# Patient Record
Sex: Male | Born: 1995 | Race: Black or African American | Hispanic: No | Marital: Single | Smoking: Never smoker
Health system: Southern US, Community
[De-identification: ages and names within clinical notes are randomized; demographics above are authoritative.]

---
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
## 2016-02-20 IMAGING — CR DG WRIST COMPLETE 3+V*L*
4 series · 4 of 4 positions shown · non-contrast
Comparison: None.

CLINICAL DATA: Pain fell onto outstretched hand

EXAM:
LEFT WRIST - COMPLETE 3+ VIEW

[x wrist pa left]
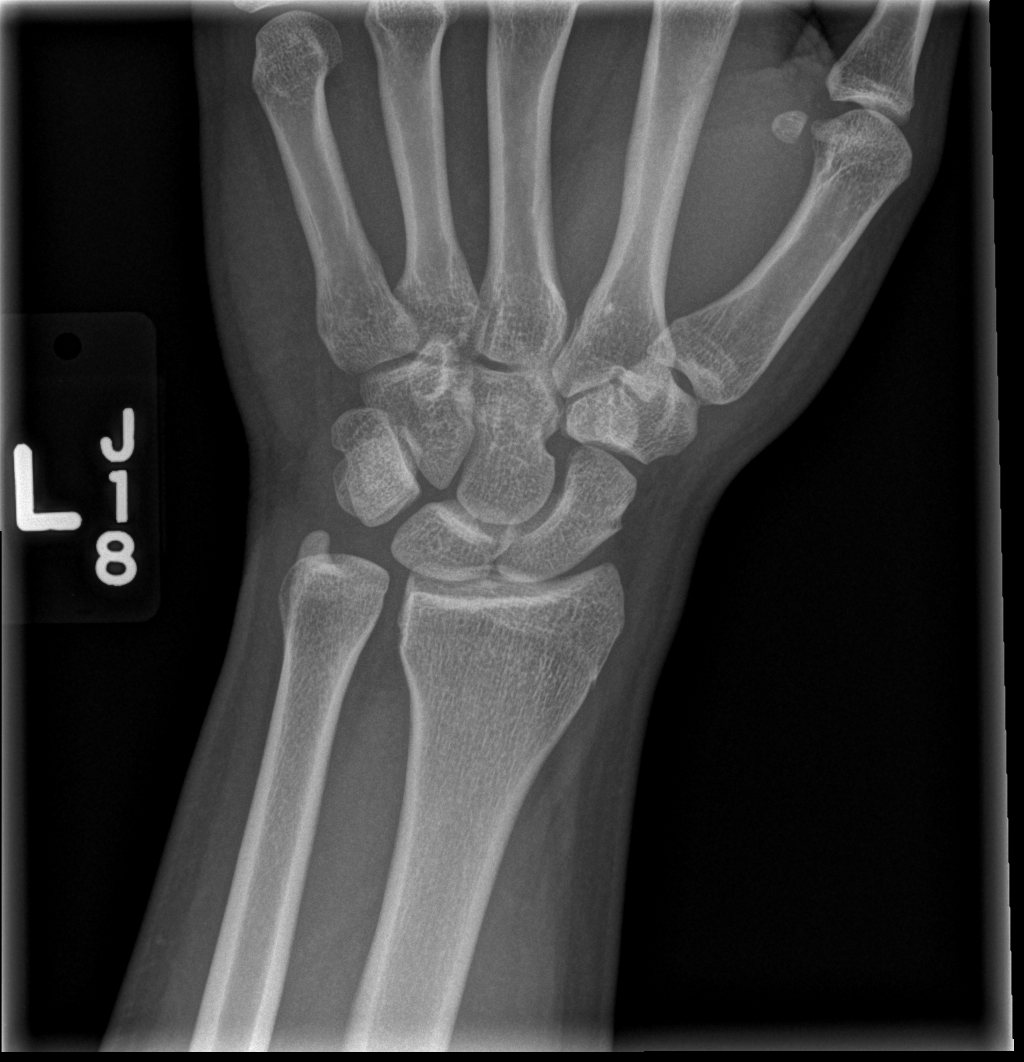

[x wrist obl left]
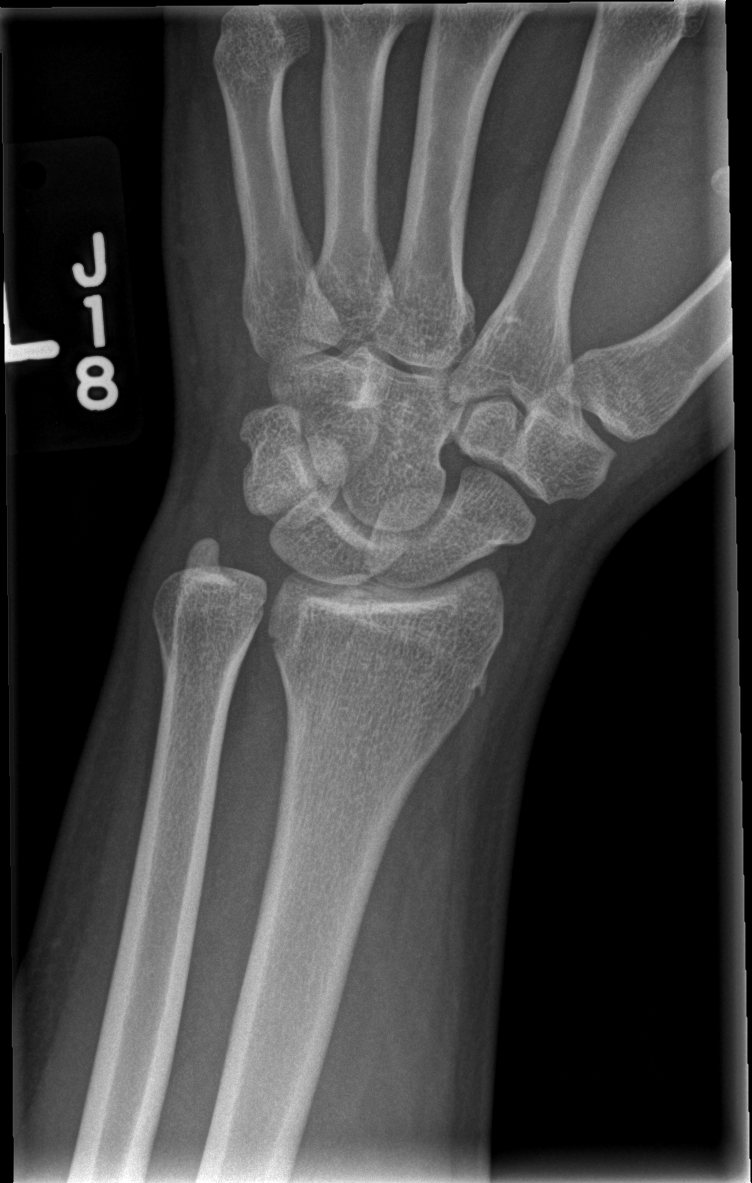

[x wrist lat left]
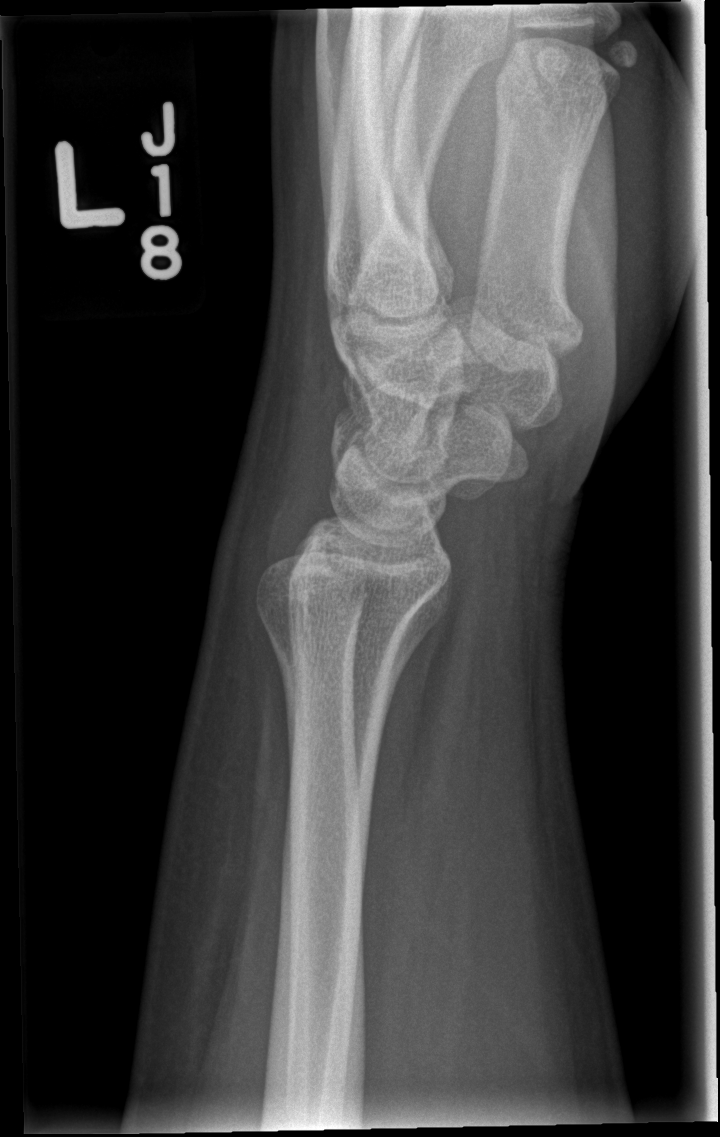

[x wrist navicular view left]
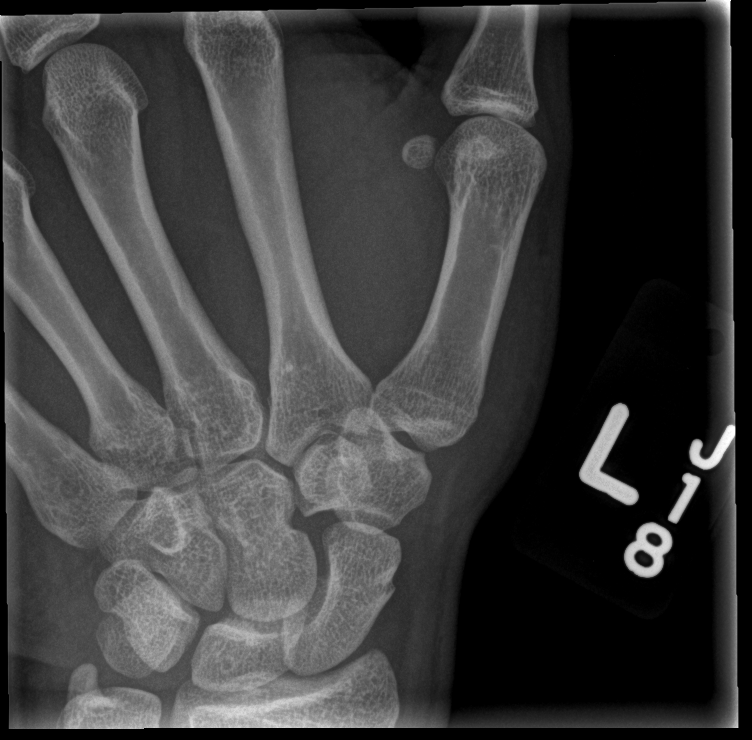

[4 of 4 positions shown; findings below may reference images not displayed]

FINDINGS: Frontal, oblique, lateral, and ulnar deviation scaphoid images were
obtained. There is a subtle linear lucency in the scaphoid bone
distally, concerning for nondisplaced fracture. No other findings
suggesting fracture. No dislocation. Joint spaces appear intact. No
erosive change.
IMPRESSION: Linear lucency in the distal scaphoid bone, best seen on the oblique
view. This appearance is concerning for a nondisplaced fracture in
this region. No other findings suggesting fracture. No dislocation.
No appreciable arthropathic change.

## 2017-06-14 ENCOUNTER — Ambulatory Visit: Admit: 2017-06-14 | Payer: PRIVATE HEALTH INSURANCE

## 2017-06-14 DIAGNOSIS — M79671 Pain in right foot: Secondary | ICD-10-CM

## 2017-06-14 NOTE — Unmapped (Signed)
Subjective   HPI:   Patient ID: Devin Miller is a 21 y.o. male.    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 No injury. No swelling or redness or fever.       He did have episode of cellulits of L foot in [place] and had 2-3 weeks of antibitotics from PMD in [place]. He says L foot is 90 % better with no more redness or pain but slight swelling persists.               Medications:       ROS:   Review of Systems   Constitutional: Negative for chills and fever.   Skin: Negative for rash.       Vitals:    06/14/17 0836   BP: 124/77   Pulse: 69   Temp: 98 ??F (36.7 ??C)   TempSrc: Oral   Weight: 213 lb (96.6 kg)   Height: 5' 10 (1.778 m)     Body mass index is 30.56 kg/m??.  Body surface area is 2.18 meters squared.     Objective:     Physical Exam   Vitals reviewed.  Constitutional: He appears well-nourished. No distress.   Musculoskeletal:   R foot is non tender and no swelling or redness, no obv rash of R foot.    L foot has slight generalized swelling but no erythema or tenderness, slight desquamation skin top L foot                       Assessment/Plan:     1. Arch pain of right foot       He points to arh area of R foot where gets pain and he is prob having some plantar fasciitis pain.   Advil or aleve w food x 5-7 d.  Stretches disc. COnsider sportsmed eval 1 wk if not better.

## 2017-12-23 ENCOUNTER — Other Ambulatory Visit: Admit: 2017-12-23 | Payer: PRIVATE HEALTH INSURANCE

## 2017-12-23 ENCOUNTER — Ambulatory Visit: Admit: 2017-12-23 | Payer: PRIVATE HEALTH INSURANCE | Attending: Adult Health

## 2017-12-23 DIAGNOSIS — R300 Dysuria: Secondary | ICD-10-CM

## 2017-12-23 DIAGNOSIS — N343 Urethral syndrome, unspecified: Secondary | ICD-10-CM

## 2017-12-23 LAB — STUDENTHS HEMOGRAM AND 3 PART DIFF
Hematocrit: 44.8 % (ref 38.5–50.0)
Hemoglobin: 15.1 g/dL (ref 13.2–17.1)
Lymphocytes Absolute: 1467.2 /uL (ref 850.0–3900.0)
Lymphocytes Relative: 26.2 % (ref 15.0–45.0)
MCH: 28.7 pg (ref 27.0–33.0)
MCHC: 33.7 g/dL (ref 32.0–36.0)
MCV: 85.1 fL (ref 80.0–100.0)
MPV: 8.6 fL (ref 7.5–11.5)
Monocytes Absolute: 397.6 /uL (ref 200.0–950.0)
Monocytes Relative: 7.1 % (ref 0.0–12.0)
Neutrophils Absolute: 3735.2 /uL (ref 1500.0–7800.0)
Neutrophils Relative: 66.7 % (ref 40.0–80.0)
Platelets: 225 10*3/uL (ref 140–400)
RBC: 5.26 10*6/uL (ref 4.20–5.80)
RDW: 13.4 % (ref 11.0–15.0)
WBC: 5.6 10*3/uL (ref 3.8–10.8)

## 2017-12-23 LAB — URINALYSIS MACROSCOPIC ONLY
Bilirubin, UA: NEGATIVE
Blood, UA: NEGATIVE
Glucose, UA: NEGATIVE mg/dL
Ketones, UA: NEGATIVE mg/dL
Leukocytes, UA: NEGATIVE
Nitrite, UA: NEGATIVE
Protein, UA: NEGATIVE mg/dL
Specific Gravity, UA: 1.02 (ref 1.005–1.015)
Urobilinogen, UA: 0.2 EU/dL
pH, UA: 7 (ref 5.0–8.0)

## 2017-12-23 LAB — CHLAMYDIA / GONORRHOEAE DNA URINE
Chlamydia Trachomatis DNA Urine: NEGATIVE
Neisseria gonorrhoeae DNA Urine: NEGATIVE

## 2017-12-23 LAB — URINE CULTURE, SPECIAL POPULATION: Culture Result: NORMAL

## 2017-12-23 MED ORDER — doxycycline (VIBRA-TABS) 100 MG tablet
100 | ORAL_TABLET | Freq: Two times a day (BID) | ORAL | 0 refills | Status: AC
Start: 2017-12-23 — End: 2017-12-30

## 2017-12-23 NOTE — Unmapped (Signed)
Subjective   HPI:   Patient ID: Devin Miller is a 22 y.o. male.    Chief Complaint:  Chief Complaint   Patient presents with   ??? Dysuria     X 4 days   ??? Urinary Urgency     X 4 days     Sx started last week with burning/tingling sensation with urination.  Urine is dark yellow. Reports he does not drink much water t/o day.  Had same sx in Nov/Dec, tx for STI, azithromycin. Reports all test results negative.  SA with women, reports no activity since Nov.  Denies fever, chills, rashes/lesions, sore throat, n/v/d, abdominal/flank or back pain, hematuria          Medications:  Current Outpatient Prescriptions   Medication Sig Dispense Refill   ??? rivaroxaban (XARELTO) 10 mg Tab Take 10 mg by mouth daily. Indications: deep venous thrombosis     ??? doxycycline (VIBRA-TABS) 100 MG tablet Take 1 tablet (100 mg total) by mouth 2 times a day for 7 days. 14 tablet 0        ROS:   Review of Systems   Constitutional: Negative for chills and fever.   Gastrointestinal: Negative for abdominal pain.   Genitourinary: Positive for dysuria, penile pain and urgency. Negative for discharge, frequency, hematuria, penile swelling, scrotal swelling and testicular pain.   Skin: Negative for rash.   All other systems reviewed and are negative.      Vitals:    12/23/17 1035   BP: 137/77   Pulse: 78   Temp: 98.9 ??F (37.2 ??C)   TempSrc: Oral   SpO2: 98%   Weight: (!) 224 lb (101.6 kg)   Height: 5' 9 (1.753 m)     Body mass index is 33.08 kg/m??.  Body surface area is 2.22 meters squared.     Objective:     Physical Exam   Vitals reviewed.  Constitutional: He appears well-developed and well-nourished. No distress.   HENT:   Head: Normocephalic and atraumatic.   Mouth/Throat: Oropharynx is clear and moist.   Neck: Neck supple.   Cardiovascular: Normal rate and regular rhythm.    Pulmonary/Chest: Effort normal and breath sounds normal.   Abdominal: Soft. Normal appearance. He exhibits no distension. There is no tenderness. There is no  rebound, no guarding and no CVA tenderness.   Genitourinary: Penis normal. Circumcised. No penile tenderness. No discharge found.   Lymphadenopathy:     He has no cervical adenopathy.   Neurological: He is alert.   Skin: Skin is warm and dry.               Assessment/Plan:     1. Urethritis, not sexually transmitted  doxycycline (VIBRA-TABS) 100 MG tablet   2. Dysuria  Urinalysis Macroscopic Only    [facility] - Hemogram w/3 Part Diff    Chlamydia / Gonorrhoeae DNA Urine    Urine Culture, Special Population   3. Exposure to potential infection  [facility] - Hemogram w/3 Part Diff    Chlamydia / Gonorrhoeae DNA Urine          Labs pending, results 2-3 days, will call if abnormal.  Doxycycline 100 mg for urinary symptoms - take one tab twice daily for 7 days.  Drink plenty of water daily 2-3 liters, urine soul be pale yellow and clear.  No sexual activity until test results completed.  Follow-up if symptoms are not improved with medication.    Patient counseled regarding diagnosis, use and  side effects of all medications.?? ??  All questions answered.

## 2017-12-23 NOTE — Unmapped (Signed)
Labs pending, results 2-3 days, will call if abnormal.  Start doxycycline 100 mg for urinary symptoms - take one tab twice daily for 7 days.  Drink plenty of water daily 2-3 liters, urine soul be pale yellow and clear.  No sexual activity until test results completed.  Follow-up if symptoms are not improved with medication.

## 2018-01-13 ENCOUNTER — Ambulatory Visit: Admit: 2018-01-13 | Payer: PRIVATE HEALTH INSURANCE

## 2018-01-13 ENCOUNTER — Other Ambulatory Visit: Admit: 2018-01-13 | Payer: PRIVATE HEALTH INSURANCE

## 2018-01-13 DIAGNOSIS — R300 Dysuria: Secondary | ICD-10-CM

## 2018-01-13 LAB — TRICHOMONAS SCREEN
Clue Cells, UR: ABSENT
Trichomonas, UA: ABSENT
Yeast, UA: ABSENT

## 2018-01-13 LAB — URINE CULTURE, SPECIAL POPULATION

## 2018-01-13 LAB — URINALYSIS MACROSCOPIC ONLY
Bilirubin, UA: NEGATIVE
Glucose, UA: NEGATIVE mg/dL
Ketones, UA: NEGATIVE mg/dL
Leukocytes, UA: NEGATIVE
Nitrite, UA: NEGATIVE
Protein, UA: NEGATIVE mg/dL
Specific Gravity, UA: 1.025 (ref 1.005–1.015)
Urobilinogen, UA: 0.2 EU/dL
pH, UA: 5.5 (ref 5.0–8.0)

## 2018-01-13 LAB — CHLAMYDIA / GONORRHOEAE DNA URINE
Chlamydia Trachomatis DNA Urine: NEGATIVE
Neisseria gonorrhoeae DNA Urine: NEGATIVE

## 2018-01-13 NOTE — Progress Notes (Signed)
Subjective   HPI:   Patient ID: Devin Miller is a 22 y.o. male.    Chief Complaint:   Chief Complaint   Patient presents with    Consult     urinary discomfort, abnormal discharge     Devin Miller is a 22 y.o. male who presents to our clinic c/o dysuria and abnormal penile discharge x 3 days.     He reports living in a dorm setting with shared laundry unit. He states he accidentally wore someone else's undergarments and realized the next day. He reports new onset sxns after realizing his mistake. He reports mild post-void burning sensation at tip of penis and clear discharge from penis past two days.     He was seen here at our clinic approx 2.5 weeks ago with urinary symptoms, empirically treated w 7-d course doxy, UA, urine culture, and GC/CT all resulted wnl. He has also been seen in outside clinics twice within the past several months for similar symptom presentation and both times treated empirically for NGU urethritis w/ labs resulting neg GC/CT. He admits to noncompliance w/ 7-d course doxy from recent visit and currently taking last day of Rx. He denies sexual activity x approx 1 year during which time he was tested positive and treated for chlamydia. Denies f/c, n/v, genital lesions, bm changes.              Medications:  Current Outpatient Prescriptions   Medication Sig Dispense Refill    rivaroxaban (XARELTO) 10 mg Tab Take 10 mg by mouth daily. Indications: deep venous thrombosis          ROS:   Review of Systems   Constitutional: Negative for chills, diaphoresis, fatigue and fever.   Gastrointestinal: Negative for abdominal pain, blood in stool, constipation, diarrhea, nausea and vomiting.   Genitourinary: Positive for discharge (clear) and dysuria. Negative for decreased urine volume, difficulty urinating, frequency, genital sores, hematuria, penile pain, penile swelling, scrotal swelling, testicular pain and urgency.   Skin: Negative for rash and wound.       Vitals:    01/13/18  0951   BP: 138/82   Pulse: 82   Temp: 98.2 F (36.8 C)   TempSrc: Oral   Weight: (!) 222 lb 10.6 oz (101 kg)   Height: 5' 9.02 (1.753 m)     Body mass index is 32.87 kg/m.  Body surface area is 2.22 meters squared.     Objective:     Physical Exam   Vitals reviewed.  Constitutional: He is oriented to person, place, and time. He appears well-developed and well-nourished. No distress.   Pulmonary/Chest: Effort normal.   Genitourinary: Testes normal and penis normal. Right testis shows no mass, no swelling and no tenderness. Left testis shows no mass, no swelling and no tenderness. No penile erythema or penile tenderness. No discharge found.   Neurological: He is alert and oriented to person, place, and time.   Psychiatric: His mood appears anxious.            Assessment/Plan:     1. Dysuria  Chlamydia / Gonorrhoeae DNA Urine    Urinalysis Macroscopic Only    Urine Culture, Special Population    Trichomonas screen   2. Penile discharge, without blood  Chlamydia / Gonorrhoeae DNA Urine     Pt very anxious and apologetic, pt reassured and encouraged inc fluids, good hygiene habits, Rx compliance, and to take extra precautionary measures to avoid use/sharing other person undergarments. Obtain labs  and results/recs to be uploaded to [facility] or by phone call.

## 2018-01-13 NOTE — Patient Instructions (Signed)
-   Increase fluids, avoid sexual activity until symptoms resolve, and await lab results/recommendations via mychart or phone call within the next few days

## 2018-06-06 ENCOUNTER — Ambulatory Visit: Admit: 2018-06-06 | Payer: PRIVATE HEALTH INSURANCE

## 2018-06-06 DIAGNOSIS — M7989 Other specified soft tissue disorders: Secondary | ICD-10-CM

## 2018-06-06 NOTE — Progress Notes (Signed)
Subjective   HPI:   Patient ID: Devin Miller is a 22 y.o. male.    Chief Complaint:   Chief Complaint   Patient presents with    Foot Swelling     right foot, painful. onset 2 weeks ago.       Devin Miller is a 22 y.o. male who presents to our clinic c/o RT foot swelling and pain x 2 weeks. He reports pain aggravated with walking or direct palpation. He does report walking more often since starting school semester 1-2 weeks ago. Denies direct trauma/injury/fall to affected area.             Medications:   Current Outpatient Prescriptions on File Prior to Visit   Medication Sig Dispense Refill    [DISCONTINUED] rivaroxaban (XARELTO) 10 mg Tab Take 10 mg by mouth daily. Indications: deep venous thrombosis       No current facility-administered medications on file prior to visit.           ROS:   Review of Systems   Constitutional: Negative for chills, diaphoresis, fatigue and fever.   Cardiovascular: Negative for chest pain and palpitations.   Gastrointestinal: Negative for nausea and vomiting.   Musculoskeletal: Positive for joint swelling (RT foot).   Skin: Negative for rash and wound.   Neurological: Negative for dizziness and light-headedness.       Vitals:    06/06/18 1318   BP: 153/84   BP Location: Left arm   Patient Position: Sitting   BP Cuff Size: Large   Pulse: 71   Temp: 98.7 F (37.1 C)   TempSrc: Oral   Weight: (!) 239 lb (108.4 kg)   Height: 5' 10 (1.778 m)     Body mass index is 34.29 kg/m.  Body surface area is 2.31 meters squared.     Objective:     Physical Exam   Vitals reviewed.  Constitutional: He is oriented to person, place, and time. He appears well-developed and well-nourished. No distress.   Musculoskeletal: He exhibits edema.        Right ankle: Normal. He exhibits normal range of motion, no swelling, no ecchymosis and no deformity. No tenderness. Achilles tendon normal.        Left ankle: Normal.        Right foot: There is bony tenderness (between 2nd and 4th  metatarsals, dorsal aspect of RT foot). There is normal range of motion.        Left foot: Normal.        Feet:    Neurological: He is alert and oriented to person, place, and time.   Skin: He is not diaphoretic.               Assessment/Plan:     1. Swelling of right foot  X-ray Foot Right min 3-views    dorsal aspect, tender, aggravated with weight bearing/walking x 2 weeks     Obtain imaging, r/o abnormal bony conditions or fx, enc rest/ice/elevation and use of otc NSAIDs prn for sxn relief.Will follow up pending XR results/recs.

## 2018-06-06 NOTE — Unmapped (Addendum)
-   Obtain imaging as directed, I will follow up with you regarding results/recs within the next week  - Keep affected foot elevated when resting, ice compresses and ibuprofen (400-600 mg every 4-6 hours as needed) for swelling and pain relief, and avoid excessive standing or walking long periods  - We may consider follow up with our sports med provider (Dr Mickel Duhamel) pending Herby Abraham results

## 2018-06-07 ENCOUNTER — Inpatient Hospital Stay
Admit: 2018-06-07 | Discharge: 2018-06-07 | Disposition: A | Discharge status: Home / Self Care | Payer: PRIVATE HEALTH INSURANCE

## 2018-06-07 ENCOUNTER — Emergency Department: Admit: 2018-06-07 | Payer: PRIVATE HEALTH INSURANCE

## 2018-06-07 DIAGNOSIS — M79671 Pain in right foot: Secondary | ICD-10-CM

## 2018-06-07 NOTE — Unmapped (Signed)
ED Attending Attestation Note    Date of service:  06/07/2018    This patient was seen by the advanced practice provider.  I have seen and examined the patient, agree with the workup, evaluation, management and diagnosis.  The care plan has been discussed and I concur.      My assessment reveals a 22 y.o. male with pain in the right foot.  He just restarted school and started walking more campus.  The pain appears to be around the arch area.  We'll x-ray but may represent early overuse injury.

## 2018-06-07 NOTE — ED Triage Notes (Signed)
Reports of right foot pain for past 2 weeks. Denies any trauma or taking OTC medications for pain management.

## 2018-06-07 NOTE — Unmapped (Signed)
It was our pleasure caring for you today.     Please follow up with Podiatry at the number provided. Please follow up with your PCP within 1 week.     It is important you follow up with your primary care provider even if you begin to feel better, following up with your primary care provider will ensure you are recovering appropriately and to determine if you will need further/ongoing management.    Please take tylenol and ibuprofen for your discomfort.     If you develop any new or worsening symptoms such as fever, chills, nausea, vomiting, chest pain, shortness of breath, abdominal pain, changes in bowel or bladder habits, weakness in your arms or legs, difficulty speaking or facial droop, please return to the Emergency Department immediately.     Your labs performed today were:    Labs Reviewed - No data to display    Your imaging study results are:    X-ray Foot Right min 3-views   Final Result   IMPRESSION:   Mild dorsal soft tissue swelling with no acute fracture.      Approved by Edwinna Areola, MD on 06/07/2018 11:33 AM EDT      I have personally reviewed the images and I agree with this report.      Report Verified by: Micheline Rough, MD at 06/07/2018 11:36 AM EDT        Take ibuprofen and tylenol for continued pain and swelling.  Rest, ice, and elevate the foot as much as possible for the next 48 hours, to minimize swelling.  You should wear the Ace wrap for support and comfort.  Ensure that the wrap is snug, but not so tight that it causes any numbness, tingling, or color changes in your toes. Please call your doctor's office, or return to the emergency department, if you have increasing pain, increasing swelling, or other concerning symptoms.    Rest:  Rest and protect the injured area.     Ice:  Apply ice or a frozen object, such as a bag of corn, etc from the freezer, to the injury. The cold will reduce swelling and pain at the injured site. This step should be done as soon as possible. Apply the frozen  object to the area for 20 minutes 5-6 times a day for the first 48 hours. Do not use heat in the first 48 hours after an injury    Compression:  Compress the injured site by applying an Ace bandage. This will decreases swelling of the injured region. Although the wrap should be snug, make sure it is not too tight as this can cause numbness, tingling, or increased pain.     Elevation:  Elevate the foot/ankle above the level of your heart (a few pillows) when at rest to reduce pain and swelling.

## 2018-06-07 NOTE — Unmapped (Signed)
RN went over AVS with patient.  RN went over follow up care with patient. Patient left in stable condition

## 2018-06-07 NOTE — Unmapped (Signed)
[facility] ED Note    Date of Service: 06/07/2018    Reason for Visit: Foot Pain      Patient History     HPI:  This is a 22 y.o. male with PMH as noted below presenting with 2 weeks of right foot pain that he states is on the top of his right foot.  He states he has been walking more than usual as he has been doing a lot of walking to classes.  She denies any trauma to his right foot, he is having no redness or swelling.  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  He is no longer taking those relative.  He denies any fevers, chills, nausea, vomiting, chest pain, shortness of breath, abdominal pain or changes to his bowel or bladder habits.  He rates his pain as a 6 out of 10, describes it as stabbing and worse with walking or if touching the area.    With the exception of the above, there are no aggravating or alleviating factors.    Past Medical History:   Diagnosis Date   ??? DVT (deep venous thrombosis) (CMS Dx) DX 2018       History reviewed. No pertinent surgical history.    Susumu Kasper Mudrick  reports that he has never smoked. He has never used smokeless tobacco. He reports that he does not drink alcohol or use drugs.    There are no discharge medications for this patient.      Allergies:   Allergies as of 06/07/2018   ??? (No Known Allergies)       PMH: Nursing notes reviewed   PSH: Nursing notes reviewed   FH: Nursing notes reviewed   MEDS: Nursing notes and chart reviewed         Review of Systems   Review of Systems   Constitutional: Negative for chills and fever.   HENT: Negative for sore throat.    Eyes: Negative for discharge and redness.   Respiratory: Negative for cough, shortness of breath and stridor.    Cardiovascular: Negative for chest pain.   Gastrointestinal: Negative  for abdominal pain, constipation, diarrhea, nausea and vomiting.   Genitourinary: Negative for dysuria, frequency and urgency.   Musculoskeletal: Negative for back pain.        Right foot pain   Neurological: Negative for dizziness and headaches.   Psychiatric/Behavioral: Negative.      Physical Exam     Vitals:    06/07/18 1035   BP: 162/90   BP Location: Right arm   Patient Position: Sitting   Pulse: 71   Resp: 16   Temp: 98.3 ??F (36.8 ??C)   TempSrc: Oral   SpO2: 99%       Constitutional- Well developed, well nourished, no apparent distress, appears stated age    Eyes- conjunctiva and sclera clear without injection or icterus    HENT- Normocephalic, atraumatic    Neck- Supple with ROM    Respiratory- CTA bilaterally, no stridor, no wheeze, rales or rhonchi, normal inspiratory volume throughout    Cardiovascular- RRR, no murmurs, rubs or gallops, 2+ pedal pulses bilaterally, no cyanosis, capillary refill <2 sec    Musculoskeletal- Normal ROM of BL upper and lower extremities, 5/5 strength in upper and lower extremities, no deformity or  edema noted, normal gait, TTP over distal metatarsals  3 and 4, no swelling to right foot, no erythema to right foot, normal range of motion of the right ankle and digits of the right foot, no tenderness to palpation to the right ankle    Skin- Warm, dry, intact    Neurologic- Alert and Oriented x 4    Psychiatric- Pleasant and cooperative, normal mood and affect, normal rate and volume of speech       Diagnostic Studies     Labs: The attending and I have reviewed laboratory findings.  Labs were reviewed and and significant values identified.    Please see electronic medical record for any tests performed in the ED     Labs Reviewed - No data to display    IMAGING STUDIES / RADIOLOGY: The attending and I have reviewed radiographic imaging.  Imaging studies were reviewed and and significant values identified.    Please see electronic medical record for any tests performed in the  ED    X-ray Foot Right min 3-views   Final Result   IMPRESSION:   Mild dorsal soft tissue swelling with no acute fracture.      Approved by Edwinna Areola, MD on 06/07/2018 11:33 AM EDT      I have personally reviewed the images and I agree with this report.      Report Verified by: Micheline Rough, MD at 06/07/2018 11:36 AM EDT        Emergency Department Procedures     None    ED Course and MDM     Vital signs, medical history, social history, allergies and nursing notes reviewed.    Vitals:  BP 162/90 (BP Location: Right arm, Patient Position: Sitting)    Pulse 71    Temp 98.3 ??F (36.8 ??C) (Oral)    Resp 16    SpO2 99%     Briefly this is a 22 y.o. male who presents to the emergency department with right foot pain.  Patient presented afebrile with normal vitals.  Patient was in no acute distress, nontoxic and non-hypoxic.  Patient was able to complete full sentences at bedside.  Patient was not using accessory muscles.  Patient was able to cooperate with history and physical exam.  Patient's previous charts, labs and imaging was reviewed.  Please see HPI and physical for further details.    On exam, this is an otherwise healthy appearing adult male in no apparent distress.  His lungs are clear to auscultation bilaterally and heart rate and rhythm are regular.  He has 2+ pedal pulses bilaterally.  He does have point tenderness to palpation around the distal 3rd and 4th metatarsals there is no obvious swelling, erythema or lesions to his right foot.  He is oriented motions of the digits of his right foot as well as his right ankle.  He has no swelling to his right lower extremity.  The right foot appears very similar to the left foot without any significant size or temperature discrepancy.  Patient has no additional systemic symptoms today.  The patient's physical exam findings aren't consistent with cellulitis or deep vein thrombosis.  There is no evidence of a septic joint.  Additionally his symptoms do not seem to  be consistent with plantar fasciitis.  Plain radiographs of the patient's right foot reveal mild soft tissue swelling to the dorsum of the right foot but otherwise no acute osseous abnormalities.  Patient is provided an Ace wrap for comfort, given  instructions for supportive management and given a referral to podiatry for an ED follow-up visit for further management of his symptoms.  Patient remained hemodynamically stable throughout his ED course without new or worsening symptoms.     The patient was seen and evaluated by the attending physician Dr. Delford Field who agreed with the assessment and plan.  The patient and / or the family were informed of the results of any tests, a time was given to answer questions, a plan was proposed and they agreed with plan.     At this point in time, patient is stable for discharge. Patient given strict return precautions as outlined in the AVS. Patient was agreeable and understanding to this plan of care. Prior to discharge, patient was ambulatory and PO tolerant.    Consults:    None    Medications administered in the ED:    Medications - No data to display    Impression     1. Right foot pain         Plan   1. The patient is to be discharged home in stable/improved condition.  2. Workup, treatment and diagnosis were discussed with the patient and/or family members; the patient agrees to the plan and all questions were addressed and answered.  3. The patient is instructed to return to the emergency department should his symptoms worsen or any concern he believes warrants acute physician evaluation.           Emmit Alexanders, [place]  06/07/18 1622

## 2019-06-26 ENCOUNTER — Institutional Professional Consult (permissible substitution): Admit: 2019-06-26 | Payer: PRIVATE HEALTH INSURANCE

## 2019-06-26 ENCOUNTER — Ambulatory Visit: Admit: 2019-06-26 | Payer: PRIVATE HEALTH INSURANCE

## 2019-06-26 DIAGNOSIS — Z20828 Contact with and (suspected) exposure to other viral communicable diseases: Secondary | ICD-10-CM

## 2019-06-26 DIAGNOSIS — R0602 Shortness of breath: Secondary | ICD-10-CM

## 2019-06-26 LAB — 2019 NOVEL CORONAVIRUS (COVID-19), NAA-B: SARS-CoV-2: NOT DETECTED

## 2019-06-26 LAB — THROAT CULTURE: Culture Result: NORMAL

## 2019-06-26 LAB — POCT RAPID STREP A: POC Strep A: NEGATIVE

## 2019-06-26 NOTE — Unmapped (Signed)
CHIEF COMPLAINT:   COVID-related concern: needs a test.  has been exposed.      Date of onset of COVID-19 Symptoms     []  No symptoms   [x]  Date:    06/25/2019     Major symptoms    Notify provider now if   []  None    []  NEW onset cough    [x]  NEW onset shortness of breath [] Shortness of breath at rest or worsening shortness of breath    []  Loss of smell    []  Loss of taste       Minor symptoms  []  None Notify provider now if Orders   [x]  Fever (measured 100.4 or greater, or subjective) [] Fever with rash, stiff neck, confusion or severe headache     []  New aches and pains      [x]  Sore throat [] Unable to swallow liquids/saliva, difficulty breathing due to swelling in throat [] Strep [id number]   [] Mono POC64 if sore throat 7 days or more   []  Chills     []  Headache     []  Fatigue     Does patient meet criteria for possible COVID-19?  (at least 1 major symptom and/or 2 or more minor symptoms)  [x]  YES   []  NO     DOES THE PATIENT HAVE PRESENT SYMPTOMS OF SEVERE ILLNESS?  []  None   []  Trouble breathing at rest    []  Unable to take fluids by mouth   []  Changes in mental status/confused   []  Other symptom of possible serious illness: __________________________________________     COVID-19 EXPOSURE?  []  None   [x]  Close contact (within 6 feet) with a patient who has a confirmed case of COVID-19 for 15 minutes or greater:  Date most recent contact occurred: ___09/21/2020__________________     ???Have you been instructed by a medical professional to isolate or quarantine?ate this sarted  [x]  No   []  Yes (document the date this started and present location patient is isolating or quarantining):         Prior COVID testing   [x]  None   []  Yes (document test date, location and result [positive, negative or pending]): ______________     Housing  []  [facility] (specify):  [] [address] [] [employer] [] [employer]   [] [employer] [] [address] [] [employer]   [] [address] [] [address] [] [address]   [] [address] [] [address] [] [employer]   [] Graduate [] [address] [] [employer] [address]      [x]  Not in [facility]           Triage plan:  []  POSSIBLE EMERGENCY (mental status changes, difficulty speaking due to shortness of breath or other emergency)  Tell patient to call 911 (call on their behalf if not able)   []  Patient with worsening symptoms and or shortness of breath:   Patient scheduled for telehealth appointment today or note routed to provider as urgent if no appointments available   [x]  Patient meets criteria for possible COVID-19 without severe illness  1. Ordering COVID test if not already done    2. Patient told to complete COVID Check app entry if not done yet (if having trouble with the app, they can also enter their information into [employer] at the following website: https://redcap.research.meratolhellas.com).    Note: information about the app and the [employer]cap link is on this page: http://www.booker.com/     3. Patient told to isolate and given instructions: isolation is 10 days from  onset of symptoms (or + COVID test if no symptoms) and 24 hours of improving symptoms and no fever  4. Patient told to call if symptoms worsen, including shortness of breath, confusion or difficulty taking oral hydration  5. Advised patient to have close contacts such as roommates to limit contact with others, contact the COVID Check app, get COVID testing (if contacts are [employer] students, they can call [employer] at [phone])  6. Housing plan:    []  [facility]  ents  [address] If patient has either not entered information into COVID Check/COVIDWatch or has been waiting for over 12 hours for a response please notify Dr Marolyn Haller through secure epic chat that patient needs transfer to isolation dorm   [x]  Not [facility] Patient encouraged to stay in a location with their own bedroom and bathroom away from others who are at high risk of COVID complications      [x]   Patient had significant contact with someone with COVID-19 but no symptoms  1. Patient offered COVID-19 testing  2. Patient told to complete COVID Check app entry if not done yet (if having trouble with the app, they can also enter their information into [employer] at the following website: https://redcap.research.meratolhellas.com).  Note: information about the app and the [employer]cap link is on this page: http://www.booker.com/  3. Patient told to quarantine and given instructions below.  Quarantine period is 14 days after most recent exposure to COVID+ person  4. Patient told to call if symptoms develop   5. Housing plan: if they are on campus they will likely need to be moved to a different location.  They will receive guidance after they send their information to the COVID Check app (or COVIDWatch)       []  Patient would like telemedicine visit     1. If you can't route the call immediately to [facility] staff, tell patient they will be contacted to schedule an appointment shortly   2. Route encounter to clerical pool.        Telephone Visit Instructions for Patients  1. We may be billing their insurance for the time spent in consultation  2.   Be available by phone at the time of their scheduled visit.         Home Isolation/Quarantine Instructions for patients with...  symptoms of COVID-19 in the past 14 days  or potential exposure to COVID-19 in the past 14 days  Stay home     Separate yourself from other people in your home      Call ahead before visiting your doctor     Wear a facemask and have others wear a mask if in the same room    Cover your coughs and sneezes with a tissue or your elbow, then wash your hands    Wash your hands for at least 20 seconds or use sanitizer    Avoid sharing household items       Monitor your symptoms   Call if you have new or worsening symptoms (such as difficulty breathing).

## 2019-06-26 NOTE — Progress Notes (Addendum)
2019 Novel Coronavirus, NAAT-B (Nasal Self-Swab Specimen) - Sent to [facility] [phone])     Point of Care Strep Antigen Test [phone]) (Throat Swab Sample) - Processed at [facility] -  If negative, throat culture will be collected and sent out to [facility] lab. [phone])

## 2019-06-29 NOTE — Telephone Encounter (Signed)
LVM to call if any concerns / worsening of s/s

## 2020-01-01 ENCOUNTER — Ambulatory Visit: Admit: 2020-01-01 | Discharge: 2020-01-01 | Payer: PRIVATE HEALTH INSURANCE

## 2020-01-01 DIAGNOSIS — Z23 Encounter for immunization: Secondary | ICD-10-CM

## 2020-01-22 ENCOUNTER — Ambulatory Visit: Admit: 2020-01-22 | Discharge: 2020-01-22 | Payer: PRIVATE HEALTH INSURANCE

## 2020-01-22 DIAGNOSIS — Z23 Encounter for immunization: Secondary | ICD-10-CM

## 2021-02-09 NOTE — ED Provider Notes (Signed)
Formatting of this note is different from the original.  Chief Complaint   Patient presents with   ? covid positive      Tested positive on Tuesday . Now having fever,cough,diarrhea     SUBJECTIVE:     Devin Miller is a 24 y.o. male who presents for evaluation of symptoms of a URI. Symptoms include sinus and nasal congestion, nasal blockage, post nasal drip and dry cough. Onset of symptoms was 4 days ago, unchanged since that time.  He is drinking plenty of fluids.   Denies severe SOB, rash, stiff neck, inability to swallow, GI sx, severe CP or extreme dizziness.   Patient is eating and drinking decreased appetite for solids but drinking fluids.  COVID vaccinated?:  Yes  Recent COVID testing?:  Yes  Tested positive for COVID-19 2 days ago.  REVIEW OF SYSTEMS:  Pertinent items are noted in HPI.    OBJECTIVE     Vitals:    02/09/21 0836   BP: (!) 150/104   BP Location: Left arm   Pulse: 89   Resp: 18   Temp: 98.3 F (36.8 C)   TempSrc: Temporal   SpO2: 99%     General Appearance:  Alert, cooperative, no distress, appropriate for age                             Head:  Normocephalic, without obvious abnormality                              Eyes:  PERRL, EOM's intact, conjunctiva and cornea clear                              Ears:  TM pearly gray, no perforation, no effusion, external ear canals normal                             Nose: Clear discharge; no sinus tenderness                           Throat:  Mucosa are moist, pink; Posterior pharynx without erythema                              Neck:  Supple; symmetrical, trachea midline, no adenopathy                            Chest:  No mass, tenderness, or discharge                            Lungs: Clear bilateral no rhonchi no wheezing  Abdomen: Soft nontender no guarding.                      Lymphatic:  No adenopathy              Skin/Hair/Nails:  Skin warm, dry and intact                    Neurologic:  Alert and oriented x3, no cranial nerve  deficits,gait steady    Medications - No data to display  Results for orders placed or performed during the hospital encounter of 02/09/21   POCT Rapid Molecular Influenza A&B (NMG/RMG)   Result Value Ref Range    POC Influenza A AG Negative Negative    POC Influenza B AG Negative Negative     Imaging Results    None    Nontoxic well-appearing 25 year old healthy male who is vaccinated for COVID-19 here for evaluation of cough and COVID symptoms.  Tolerating liquids and solids without difficulty.  Patient expresses that his family wanted him to be evaluated for proximal COVID.  No autoimmune disorders.  Conservative treatment recommended with over-the-counter medications influenza negative.  He is aware if any chest pain palpitations shortness of breath difficulty breathing to return or to proceed to the ER.  Over-the-counter medication regimens reviewed with patient  ASSESSMENT     1. Cough    2. COVID-19        PLAN:       Discussed diagnosis and treatment of URI., Suggested symptomatic OTC remedies., Nasal saline spray for congestion., Follow up as needed.    I have given the patient instructions regarding his diagnosis, expectations, follow up, and return to the [facility] precautions.  I explained to the patient that emergent conditions may arise to return to the immediate care or go to the emergency room for new, worsening or any persistent conditions.  I've explained the importance of following up with his doctor- No primary care provider on file. - as instructed.  The patient verbalized understanding of the discharge instructions and plan.        San Jetty R., PA-C  02/09/21 0914      San Jetty R., PA-C  02/09/21 [phone]    Electronically signed by San Jetty R., PA-C at 02/09/2021  9:18 AM CDT

## 2022-07-01 NOTE — Unmapped (Signed)
Formatting of this note might be different from the original.  CURRENT STATUS IS EMERGENCY .     :    Any questions regarding PATIENT CLASS/STATUS should be directed to the Care Management Departments:  [facility] [phone] or [facility] [phone] or [facility]  [phone].  Electronically signed by Notify, [facility], MD at 07/02/2022 10:43 AM EDT

## 2022-07-01 NOTE — Unmapped (Signed)
Formatting of this note is different from the original.  [facility]  ED Encounter Arrival Date: 07/01/22 1431  Devin Miller                            DOB: 12-Nov-1995  [address]  [place] [address]   MRN: [id number]   CSN: [id number]     HAR: [id number]     EMERGENCY DEPARTMENT   EMERGENCY DEPARTMENT -  Extremity NOTE    CHIEF COMPLAINT    Chief Complaint   Patient presents with   ? Hand Injury     Patient was slicing a package of frozen sausage and the knife slipped and he has a small puncture wound to the middle of left palm     HPI    Devin Miller is a 26 year old male who presents with a hand laceration. Patient was slicing a package of frozen sausage and the knife slipped and punctured his palm. Laceration is in the middle of his left palm. Denies numbness, tingling.      REVIEW OF SYSTEMS    Musculoskeletal: Positive for left hand laceration.   See HPI for further details.  All other review of systems otherwise negative    PAST MEDICAL HISTORY    Past Medical History:   Diagnosis Date   ? Anxiety    ? Lymphedema      SURGICAL HISTORY    No past surgical history on file.    CURRENT MEDICATIONS    Prior to Admission medications    Not on File     ALLERGIES    No Known Allergies    FAMILY HISTORY    History reviewed. No pertinent family history.    SOCIAL HISTORY    Social History     Socioeconomic History   ? Marital status: Single   Vaping Use   ? Vaping Use: Never used   Substance and Sexual Activity   ? Alcohol use: Yes     Comment: occas   ? Drug use: Not Currently     PHYSICAL EXAM    VITAL SIGNS: BP 159/83   Pulse 87   Temp 98.6 F (37 C) (Oral)   Resp 18   SpO2 97%   Constitutional:  Well developed, well nourished, no acute distress, non-toxic appearance   HENT:  Atraumatic, external ears normal, nose normal, oropharynx moist.  Neck- normal range of motion, no tenderness, supple   Respiratory:  No respiratory distress  Cardiovascular:  Normal distal pulses   GI:   Soft  Musculoskeletal:  .5 Cm puncture to the center of the left palm. Full grasp, normal sensation. Normal capillary refill.    Integument:  Well hydrated, no rash   Neurologic: No focal deficits     RADIOLOGY    No orders to display     ED COURSE & MEDICAL DECISION MAKING    Laceration Repair Procedure Note    Indication: Laceration    Procedure: The patient was placed in the appropriate position and anesthesia around the laceration was obtained by infiltration using 1% Lidocaine without epinephrine. The area was then cleansed with Shur-Clens and draped in a sterile fashion. The laceration was closed with 4-0 Ethilon using interrupted sutures. There were no additional lacerations requiring repair. The wound area was then dressed with a sterile dressing.      Total repaired wound length: .5 cm.     Other Items:  Suture count: 1    The patient tolerated the procedure well.    Complications: None    Electronically Signed by: @[email or web address] @    Take over-the-counter medication for any pain.  He can get the sutures removed in 10 days.  He will be discharged at this time    Patient was given the signs and symptoms of worsening condition and told to return if they occurred.  Patient understands the plan and management and all questions were answered.  Patient will follow up with his primary care physician in the next 2-3 days.  Patient was instructed on Culture results and preliminary radiology reads and that he would be contacted if a variance or positive test is obtained.  The nursing note was reviewed, agreed and appreciated.      Final Diagnosis:    1. Laceration of left hand without foreign body, initial encounter      The patient was given the following medications to go home with.    New Prescriptions    No medications on file     By signing my name below, I, Rogue Bussing, attest that this documentation has been prepared under the direction and in the presence of Sharyne Peach, MD  Scribe name: Rogue Bussing  Date: 07/01/2022    Sharyne Peach, MD   Attending Physician Attestation  The scribe?s documentation has been prepared under my direction and personally reviewed by me in its entirety. I confirm that the note above accurately reflects all work, treatment, procedures, and medical decision making performed by me.     Attending Physician Emergency Medicine  07/01/2022       Sharyne Peach, MD  07/01/22 1517    Electronically signed by Sharyne Peach, MD at 07/01/2022  3:17 PM EDT

## 2022-07-01 NOTE — Unmapped (Signed)
Formatting of this note might be different from the original.  To bed 3 with puncture wound to left palm from a kitchen knife as he was trying to separate 2 pieces of frozen sausage. He has very small puncture wound to center of left palm with minimal bleeding.  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

## 2022-07-13 ENCOUNTER — Institutional Professional Consult (permissible substitution): Admit: 2022-07-13 | Discharge: 2022-07-13 | Payer: PRIVATE HEALTH INSURANCE

## 2022-07-13 DIAGNOSIS — Z4802 Encounter for removal of sutures: Secondary | ICD-10-CM

## 2022-07-13 NOTE — Unmapped (Signed)
Area cleansed. No s/s of infection noted. Removed 1 sutures from  right hand. No complaints of pain or discomfort. Patient advised to return to office if redness, swelling or increase in pain occur. Follow-up as needed.

## 2022-08-15 ENCOUNTER — Ambulatory Visit: Admit: 2022-08-15 | Discharge: 2022-08-15 | Payer: PRIVATE HEALTH INSURANCE | Attending: Family

## 2022-08-15 DIAGNOSIS — I10 Essential (primary) hypertension: Secondary | ICD-10-CM

## 2022-08-15 MED ORDER — propranoloL (INDERAL) 10 MG tablet
10 | ORAL_TABLET | ORAL | 0 refills
Start: 2022-08-15 — End: 2022-08-15

## 2022-08-15 MED ORDER — propranoloL (INDERAL) 10 MG tablet
10 | ORAL_TABLET | ORAL | 0 refills | Status: AC
Start: 2022-08-15 — End: ?

## 2022-08-15 MED ORDER — hydroCHLOROthiazide (MICROZIDE) 12.5 MG tablet
12.5 | ORAL_TABLET | Freq: Every day | ORAL | 0 refills
Start: 2022-08-15 — End: 2022-08-15

## 2022-08-15 MED ORDER — amLODIPine (NORVASC) 5 MG tablet
5 | ORAL_TABLET | Freq: Every day | ORAL | 0 refills
Start: 2022-08-15 — End: 2022-08-15

## 2022-08-15 MED ORDER — hydroCHLOROthiazide (MICROZIDE) 12.5 MG tablet
12.5 | ORAL_TABLET | Freq: Every day | ORAL | 0 refills | 90.00000 days
Start: 2022-08-15 — End: 2022-08-27

## 2022-08-15 MED ORDER — amLODIPine (NORVASC) 5 MG tablet
5 | ORAL_TABLET | Freq: Every day | ORAL | 0 refills | Status: AC
Start: 2022-08-15 — End: ?

## 2022-08-15 NOTE — Unmapped (Signed)
Start hydrochlorothiazide 12.5 mg in the morning and amlodipine 5 mg daily at night for your blood pressure    Please go to [facility] at [facility] for a blood test at your earliest convenience.   The lab is open Mon, Tues, Thurs, Fri 8:30-4:45, Wed 9:30-4:45, closed for lunch daily 12:45-1:15 pm.   No appointment is needed, the lab orders are in your chart.    You can buy an Omron large arm blood pressure cuff and check your BP about 2-3 times weekly. Record and bring your readings to your next appointment with Dr. Cleotis Nipper before you leave town.

## 2022-08-15 NOTE — Unmapped (Signed)
[facility]  [facility]  [address] [place] [address]  Subjective   Name:  Devin Miller Date of Birth: 01-06-1996 (26 y.o.)   MRN: [id number]    Date of Service:  08/15/2022      Chief Complaint   Patient presents with    Other     Rx for Propranolol     History of Present Illness:  Devin Miller is a(n) 25 y.o. male here today for the following:   HPI    Devin Miller is here for a prescription for propranolol 10 mg for performance anxiety. He states he has taken 10-20 mg about 1 hour before performances with good relief of performance related anxiety. In addition, his BP is elevated today. He states he knows he needs to work on improving his diet, and he does not know if his BP has been elevated in the past. Additionally, he is asking for labs because his mother has DM and HTN.   He does not have a history of asthma  Hx DVT and lymphedema b/l LE. He says he usually wears compression stockings but he admits he has not worn them recently. He states he has been meaning to schedule an appt. For care but has not made time to do so.      Patient Active Problem List   Diagnosis    Urethritis, not sexually transmitted    Family history of diabetes mellitus (DM)    Lymphedema    Performance anxiety    Primary hypertension         Current Outpatient Medications   Medication Sig Dispense Refill    propranoloL (INDERAL) 10 MG tablet Take 1-2 tablets about 1 hour before performance as needed. 30 tablet 0    amLODIPine (NORVASC) 5 MG tablet Take 1 tablet (5 mg total) by mouth daily. 30 tablet 0    hydroCHLOROthiazide (MICROZIDE) 12.5 MG tablet Take 1 tablet (12.5 mg total) by mouth daily. 30 tablet 0     No current facility-administered medications for this visit.      Review of Systems   Constitutional:  Negative for activity change, appetite change, fatigue and fever.   Respiratory:  Negative for cough and chest tightness.    Cardiovascular:  Negative for chest  pain.   Musculoskeletal:  Negative for gait problem.   Skin:  Negative for rash.   Neurological:  Negative for headaches.   Hematological:  Does not bruise/bleed easily.   Psychiatric/Behavioral:  The patient is nervous/anxious.            Objective   Vitals:    08/15/22 1627 08/15/22 1632   BP: (!) 157/105 (!) 153/102   Pulse: 76 76   Temp: 98.4 F (36.9 C)    SpO2: 97%    Height: 5' 11 (1.803 m)    Weight: (!) 282 lb (127.9 kg)    BMI (Calculated): 39.35      Body mass index is 39.33 kg/m.        Physical Exam  Vitals reviewed.   Constitutional:       General: He is not in acute distress.     Appearance: Normal appearance. He is well-developed. He is not diaphoretic.   HENT:      Head: Normocephalic and atraumatic.   Cardiovascular:      Rate and Rhythm: Normal rate and regular rhythm.      Pulses: Normal pulses.  Heart sounds: Normal heart sounds. No murmur heard.     No friction rub. No gallop.   Pulmonary:      Effort: Pulmonary effort is normal. No respiratory distress.      Breath sounds: Normal breath sounds.   Musculoskeletal:         General: Normal range of motion.      Right lower leg: Edema present.      Left lower leg: Edema present.   Skin:     General: Skin is warm and dry.   Neurological:      General: No focal deficit present.      Mental Status: He is alert.   Psychiatric:         Behavior: Behavior normal.         Thought Content: Thought content normal.         Judgment: Judgment normal.                  Assessment & Plan     Masud was seen today for other.    Diagnoses and all orders for this visit:    Primary hypertension (Primary)  -     Comprehensive Metabolic Panel, Serum; Future  -     Thyroid Function Cascade; Future  -     Hemoglobin A1c; Future  -     HIV 1+2 Antibody/Antigen with Reflex; Future  -     Hepatitis C Antibody; Future  -     Vitamin D 25 hydroxy; Future  -     Lipid Profile; Future  -     Discontinue: amLODIPine (NORVASC) 5 MG tablet; Take 1 tablet (5 mg total) by  mouth daily.  -     Discontinue: hydroCHLOROthiazide (MICROZIDE) 12.5 MG tablet; Take 1 tablet (12.5 mg total) by mouth daily.  -     Urinalysis-Macroscopic w/Rfx to Microsco; Future  -     Microalbumin (Random UR); Future  -     Aldosterone/Renin Ratio; Future  -     amLODIPine (NORVASC) 5 MG tablet; Take 1 tablet (5 mg total) by mouth daily.  -     hydroCHLOROthiazide (MICROZIDE) 12.5 MG tablet; Take 1 tablet (12.5 mg total) by mouth daily.    Performance anxiety  -     Discontinue: propranoloL (INDERAL) 10 MG tablet; Take 1-2 tablets about 1 hour before performance as needed.  -     propranoloL (INDERAL) 10 MG tablet; Take 1-2 tablets about 1 hour before performance as needed.    Family history of diabetes mellitus (DM)    Lymphedema  Comments:  since 2019    Flu vaccine need  -     FLUZONE Quadrivalent Vaccine 84 Months of Age and Older PF IM (0.5 mL dose)    Other orders  -     MyChart Remote Patient Monitor BP Flowsheet        Will go to [facility] lab tomorrow   Start hctz and amlodipine daily  Propranolol refilled  Follow up with MD in 1-2 wks    This was an in-person visit. I spent 30 minutes speaking with the patient, conducting an interview, performing a limited exam, and educating the patient on my assessment and plan. I also spent 15 minutes, on the same day as the encounter, obtaining and/or reviewing separately obtained history, ordering medications, tests, or procedures and documenting clinical information in the electronic or other health record.  Janith Lima, CNP

## 2022-08-16 ENCOUNTER — Other Ambulatory Visit: Admit: 2022-08-16 | Payer: PRIVATE HEALTH INSURANCE

## 2022-08-16 DIAGNOSIS — I10 Essential (primary) hypertension: Secondary | ICD-10-CM

## 2022-08-16 LAB — URINALYSIS-MACROSCOPIC W/REFLEX TO MICROSCOPIC
Bilirubin, UA: NEGATIVE
Blood, UA: NEGATIVE
Glucose, UA: NEGATIVE mg/dL
Ketones, UA: NEGATIVE mg/dL
Leukocytes, UA: NEGATIVE
Nitrite, UA: NEGATIVE
Protein, UA: 100 mg/dL
Specific Gravity, UA: 1.021 (ref 1.005–1.035)
Urobilinogen, UA: <2 mg/dL (ref 0.2–1.9)
pH, UA: 7 (ref 5.0–8.0)

## 2022-08-16 LAB — COMPREHENSIVE METABOLIC PANEL, SERUM
ALT: 9 U/L (ref 7–52)
AST (SGOT): 14 U/L (ref 13–39)
Albumin: 4.2 g/dL (ref 3.5–5.7)
Alkaline Phosphatase: 56 U/L (ref 36–125)
Anion Gap: 9 mmol/L (ref 3–16)
BUN: 14 mg/dL (ref 7–25)
CO2: 25 mmol/L (ref 21–33)
Calcium: 9.2 mg/dL (ref 8.6–10.3)
Chloride: 105 mmol/L (ref 98–110)
Creatinine: 1 mg/dL (ref 0.60–1.30)
EGFR: >90
Glucose: 104 mg/dL (ref 70–100)
Osmolality, Calculated: 289 mOsm/kg (ref 278–305)
Potassium: 4.1 mmol/L (ref 3.5–5.3)
Sodium: 139 mmol/L (ref 133–146)
Total Bilirubin: 0.6 mg/dL (ref 0.0–1.5)
Total Protein: 7.5 g/dL (ref 6.4–8.9)

## 2022-08-16 LAB — MICROALBUMIN (RANDOM UR)
Creatinine, Urine: 251 mg/dL
Microalb / UCreat: 24.7 mg/G (ref 0.0–30.0)
Microalb, Ur: 62.1 mg/L (ref 0.0–17.0)

## 2022-08-16 LAB — THYROID FUNCTION CASCADE: TSH: 1.46 u[IU]/mL (ref 0.45–4.12)

## 2022-08-16 LAB — URINALYSIS, MICROSCOPIC
Hyaline Casts, UA: 3 /LPF (ref 0–2)
RBC, UA: 1 /HPF (ref 0–3)
Squam Epithel, UA: 1 /HPF (ref 0–5)
WBC, UA: 18 /HPF (ref 0–5)

## 2022-08-16 LAB — LIPID PANEL
Cholesterol, Total: 158 mg/dL (ref 0–200)
HDL: 38 mg/dL (ref 60–92)
LDL Cholesterol: 105 mg/dL
Non-HDL Cholesterol, Calculated: 120 mg/dL (ref 0–129)
Triglycerides: 76 mg/dL (ref 10–149)

## 2022-08-16 LAB — HEMOGLOBIN A1C: Hemoglobin A1C: 5.8 % (ref 4.0–5.6)

## 2022-08-16 LAB — HEPATITIS C ANTIBODY: HCV Ab: NONREACTIVE

## 2022-08-16 LAB — VITAMIN D 25 HYDROXY: Vit D, 25-Hydroxy: 9.8 ng/mL (ref 30.0–100.0)

## 2022-08-16 LAB — HIV 1+2 ANTIBODY/ANTIGEN WITH REFLEX: HIV 1+2 AB/AGN: NONREACTIVE

## 2022-08-17 MED ORDER — ergocalciferol (ERGOCALCIFEROL) 1,250 mcg (50,000 unit) capsule
1250 | ORAL_CAPSULE | ORAL | 0 refills | Status: AC
Start: 2022-08-17 — End: ?

## 2022-08-27 ENCOUNTER — Ambulatory Visit: Admit: 2022-08-27 | Discharge: 2022-08-27 | Payer: PRIVATE HEALTH INSURANCE

## 2022-08-27 ENCOUNTER — Other Ambulatory Visit: Admit: 2022-08-27 | Payer: PRIVATE HEALTH INSURANCE

## 2022-08-27 DIAGNOSIS — R8290 Unspecified abnormal findings in urine: Secondary | ICD-10-CM

## 2022-08-27 DIAGNOSIS — R7303 Prediabetes: Secondary | ICD-10-CM

## 2022-08-27 LAB — BASIC METABOLIC PANEL
Anion Gap: 7 mmol/L (ref 3–16)
BUN: 13 mg/dL (ref 7–25)
CO2: 29 mmol/L (ref 21–33)
Calcium: 9.3 mg/dL (ref 8.6–10.3)
Chloride: 103 mmol/L (ref 98–110)
Creatinine: 0.88 mg/dL (ref 0.60–1.30)
EGFR: >90
Glucose: 106 mg/dL (ref 70–100)
Osmolality, Calculated: 289 mOsm/kg (ref 278–305)
Potassium: 4.1 mmol/L (ref 3.5–5.3)
Sodium: 139 mmol/L (ref 133–146)

## 2022-08-27 LAB — URINALYSIS-MACROSCOPIC W/REFLEX TO MICROSCOPIC
Bilirubin, UA: NEGATIVE
Blood, UA: NEGATIVE
Glucose, UA: NEGATIVE mg/dL
Ketones, UA: NEGATIVE mg/dL
Leukocytes, UA: NEGATIVE
Nitrite, UA: NEGATIVE
Protein, UA: NEGATIVE mg/dL
Specific Gravity, UA: 1.019 (ref 1.005–1.035)
Urobilinogen, UA: <2 mg/dL (ref 0.2–1.9)
pH, UA: 6 (ref 5.0–8.0)

## 2022-08-27 LAB — MICROALBUMIN (RANDOM UR)
Creatinine, Urine: 154.4 mg/dL
Microalb / UCreat: 15 mg/G (ref 0.0–30.0)
Microalb, Ur: 23.2 mg/L (ref 0.0–17.0)

## 2022-08-27 LAB — CHLAMYDIA / GONORRHOEAE DNA URINE
Chlamydia Trachomatis DNA Urine: NEGATIVE
Neisseria gonorrhoeae DNA Urine: NEGATIVE

## 2022-08-27 LAB — PROTEIN / CREATININE RATIO, URINE
Creatinine, Urine: 154.4 mg/dL
Prot/Creat Ratio, Ur: 0.09 ratio
Total Protein, Ur: 14 mg/dL

## 2022-08-27 LAB — ALDOSTERONE/RENIN RATIO
Aldosterone/Renin Ratio: 2.9 (ref 0.0–30.0)
Aldosterone: 6 ng/dL (ref 0.0–30.0)
Renin Activity: 2.062 ng/mL/hr (ref 0.167–5.380)

## 2022-08-27 LAB — URINE CULTURE, OUTPATIENT: Culture Result: NO GROWTH

## 2022-08-27 NOTE — Unmapped (Signed)
Stopping the diuretic  Continue amlodipine  Rechecking urine. If persistent protein leaking, will add a drug called an ARB. This can eventually be prescribed as a single combo pill with the amlodipine   Checking aldosterone and renin to help guide blood pressure treatment  Referring to sleep study for likely obstructive sleep apnea.          Home blood pressure measurement  Home blood pressure monitoring  Choose validated cuff: upper arm, automated cuff from Omron is good.  Perform measurement with proper technique:     Record 2 readings 1-3 minutes apart in the morning and the evening for 7 days on a log called mychart BP flowsheet in the mychart patient portal  Call with BP that is too low or too high: Call if BP is less than 110/60 and or getting light headed, or over 160/100 for several readings or 180/110 or more on any one reading     Make lifestyle changes that will lower your pressure  Sleep at least 7.5 hours a night  Get tested for obstructive sleep apnea if you have loud snoring and still feel sleepy after a good night's sleep  Limit alcohol to no more than 1 a day  Heart healthy diet such as the Mediterranean or DASH diet   Milled flax seed 30 grams a day  Stop any non steroidal antiinflammatory drugs such as ibuprofen (advil) or naprosyn (alleve)

## 2022-08-27 NOTE — Unmapped (Signed)
Referring to audiology.   Treating nasal congestion issues     1. Nasal saline irrigations - at least once daily: NeilMed Sinus Rinse or Neti Pot (available over the counter).  2. Flonase (fluticazone) [topical steroid] nasal spray - available over the counter.  Avoid if history of glaucoma.  2 sprays per nostril once daily for 2 weeks; if no benefit, increase to twice daily for 2 weeks.  Aim away from middle of nose (septum) when using.  If nose becomes dry, add over the counter saline nasal spray (eg Ocean spray) as needed.  Continue for at least 4- 6 weeks to determine if beneficial.

## 2022-08-27 NOTE — Unmapped (Signed)
Referring to physical therapy

## 2022-08-27 NOTE — Unmapped (Signed)
Your symptoms may be due to obstructive sleep apnea.  Call to schedule your sleep study. Call: [phone]  Avoid driving when tired.

## 2022-08-27 NOTE — Unmapped (Signed)
A/P:   Problem List Items Addressed This Visit          Cardiovascular and Mediastinum    Primary hypertension     Stopping the diuretic  Continue amlodipine  Rechecking urine. If persistent protein leaking, will add a drug called an ARB. This can eventually be prescribed as a single combo pill with the amlodipine   Checking aldosterone and renin to help guide blood pressure treatment  Referring to sleep study for likely obstructive sleep apnea.          Home blood pressure measurement  Home blood pressure monitoring  Choose validated cuff: upper arm, automated cuff from Omron is good.  Perform measurement with proper technique:     Record 2 readings 1-3 minutes apart in the morning and the evening for 7 days on a log called mychart BP flowsheet in the mychart patient portal  Call with BP that is too low or too high: Call if BP is less than 110/60 and or getting light headed, or over 160/100 for several readings or 180/110 or more on any one reading     Make lifestyle changes that will lower your pressure  Sleep at least 7.5 hours a night  Get tested for obstructive sleep apnea if you have loud snoring and still feel sleepy after a good night's sleep  Limit alcohol to no more than 1 a day  Heart healthy diet such as the Mediterranean or DASH diet   Milled flax seed 30 grams a day  Stop any non steroidal antiinflammatory drugs such as ibuprofen (advil) or naprosyn (alleve)                       Relevant Orders    Aldosterone/Renin Ratio    Basic metabolic panel    DVT (deep venous thrombosis) (CMS-HCC)       Other    Sleep-disordered breathing     Your symptoms may be due to obstructive sleep apnea.  Call to schedule your sleep study. Call: [phone]  Avoid driving when tired.            Relevant Orders    Sleep Medicine    Abnormal urinalysis     Rechecking urine with culture and for sexually transmitted infection.            Relevant Orders    Urinalysis - Macroscopic w/ Reflex to Microscopic    Urine Culture,  Outpatient    Chlamydia / Gonorrhoeae DNA Urine    Protein / creatinine ratio, urine    Microalbumin (Random UR)    Chronic midline thoracic back pain     Referring to physical therapy          Relevant Orders    Physical Therapy    Decreased hearing of both ears     Referring to audiology.   Treating nasal congestion issues     1. Nasal saline irrigations - at least once daily: NeilMed Sinus Rinse or Neti Pot (available over the counter).  2. Flonase (fluticazone) [topical steroid] nasal spray - available over the counter.  Avoid if history of glaucoma.  2 sprays per nostril once daily for 2 weeks; if no benefit, increase to twice daily for 2 weeks.  Aim away from middle of nose (septum) when using.  If nose becomes dry, add over the counter saline nasal spray (eg Ocean spray) as needed.  Continue for at least 4- 6 weeks to determine if beneficial.  Prediabetes - Primary       Return in about 4 weeks (around 09/24/2022).       SUBJECTIVE:   Chief Complaint   Patient presents with    Hypertension    Back Pain     For a few week time.          Hypertension  This is a chronic problem. The current episode started more than 1 year ago (noted to be elevated in [place]). The problem has been gradually improving since onset. The problem is controlled. Associated symptoms include blurred vision (due to needing glasses) and peripheral edema (R since DVT 2019). Pertinent negatives include no anxiety, chest pain, headaches, malaise/fatigue, neck pain, orthopnea, palpitations, PND, shortness of breath or sweats. There are no associated agents to hypertension. Risk factors for coronary artery disease include obesity and male gender. Past treatments include beta blockers, calcium channel blockers and diuretics. There are no compliance problems.  There is no history of angina, kidney disease, CAD/MI, CVA, heart failure or PVD.   Back Pain  This is a chronic problem. The current episode started more than 1 year ago  (whenever playing the double bass. R shoulder blade feels weird). Episode frequency: when playing his instrument and for a time afterwards. The problem has been waxing and waning since onset. The pain is present in the thoracic spine. The quality of the pain is described as aching. The pain does not radiate. The pain is at a severity of 7/10. Worse during: affects performance. Stiffness is present: none. Pertinent negatives include no chest pain, headaches, numbness, tingling or weakness. Treatments tried: icy hot, no meds.     Hearing loss  Is off and on, both ears. Couple of years. Has nasal congestion   He denies a history of ear pain, or aural discharge. Hearing loss lasts minutes at a time intermittently. Struggles to hear conversations often.       Problem   H/O Deep Venous Thrombosis   Sleep-Disordered Breathing   Abnormal Urinalysis    He denies urethral discharge, dysuria, hematuria or sores on the genitals. No lumps or pain in the testicles.       Chronic Midline Thoracic Back Pain    Over one year associated with playing double bass      Decreased Hearing of Both Ears   Prediabetes    Lab Results   Component Value Date    HGBA1C 5.8 (H) 08/16/2022         Primary Hypertension   Dvt (Deep Venous Thrombosis) (Cms-Hcc)    R LE as a complication of cellulitis in foot. No family history of VTE. No other VTE. Has residual lymphedema treated with graduated compression stockings.      Urethritis, Not Sexually Transmitted (Resolved)     I have reviewed the patient's allergies, medical history and social history in detail and updated the computerized patient record.      Medications, including over the counter, reviewed with patient.           ROS: see HPI  Review of Systems   Constitutional:  Negative for malaise/fatigue.   HENT:  Positive for hearing loss and tinnitus. Negative for congestion, ear discharge and ear pain.    Eyes:  Positive for blurred vision (due to needing glasses).   Respiratory:  Negative  for shortness of breath.    Cardiovascular:  Negative for chest pain, palpitations, orthopnea and PND.   Musculoskeletal:  Positive for back pain. Negative for neck pain.  Neurological:  Negative for tingling, weakness, numbness and headaches.        Health maintenance:    Health Maintenance Due   Topic Date Due    Immunization: Hepatitis B (1 of 3 - 3-dose series) Never done    Comprehensive Physical Exam  Never done    Immunization: DTaP/Tdap/Td (1 - Tdap) Never done    Immunization: COVID-19 (3 - 2023-24 season) 06/01/2022       OBJECTIVE    Vitals:    08/27/22 0938 08/27/22 0941   BP: (!) 145/93 142/89   BP Location: Right upper arm Right upper arm   Patient Position: Sitting Sitting   BP Cuff Size: Large Large   Pulse: 98 97   Temp: 97.9 F (36.6 C)    TempSrc: Temporal    Weight: (!) 280 lb (127 kg)    Height: 5' 11 (1.803 m)      Wt Readings from Last 5 Encounters:   08/27/22 (!) 280 lb (127 kg)   08/15/22 (!) 282 lb (127.9 kg)   06/06/18 (!) 239 lb (108.4 kg)   01/13/18 (!) 222 lb 10.6 oz (101 kg)   12/23/17 (!) 224 lb (101.6 kg)     Body mass index is 39.05 kg/m.       No data to display               No results found.   Physical Exam    Ear exam - right, left normal, TM intact without perforation or effusion, external canal normal. No significant ceruminosis noted.  Nasal congestion noted  Throat exam normal. Oral cavity, tongue, pharynx and palate have no inflammation or suspicious lesions. Tonsils - tonsils are present and normal. Teeth normal   Neck: trachea midline.  No lymphadenopathy.  Thyroid normal with no nodules or goiter  General appearance: alert, well appearing, and in no distress.  CVS exam: normal rate, regular rhythm, normal S1, S2, no murmurs, rubs, clicks or gallops.  Chest: clear to auscultation, no wheezes, rales or rhonchi, symmetric air entry  Non pitting edema in lower extremities.       Number and Complexity of Problems Addressed  1 or more chronic illness with exacerbation,  progression, or side effects of treatment  1 undiagnosed new problem with uncertain prognosis    Amount and/or Complexity of Data to be Reviewed and Analyzed  2 unique tests ordered    Risk of Complications and/or Morbidity or Mortality of Patient Management  Moderate    Time  I spent a total of 42 minutes on the day of the visit.

## 2022-08-27 NOTE — Unmapped (Addendum)
Sleep-disordered breathing  Your symptoms may be due to obstructive sleep apnea.  Call to schedule your sleep study. Call: [phone]  Avoid driving when tired.       Abnormal urinalysis  Rechecking urine with culture and for sexually transmitted infection.       Primary hypertension  Stopping the diuretic  Continue amlodipine  Rechecking urine. If persistent protein leaking, will add a drug called an ARB. This can eventually be prescribed as a single combo pill with the amlodipine   Checking aldosterone and renin to help guide blood pressure treatment  Referring to sleep study for likely obstructive sleep apnea.          Home blood pressure measurement  Home blood pressure monitoring  Choose validated cuff: upper arm, automated cuff from Omron is good.  Perform measurement with proper technique:     Record 2 readings 1-3 minutes apart in the morning and the evening for 7 days on a log called mychart BP flowsheet in the mychart patient portal  Call with BP that is too low or too high: Call if BP is less than 110/60 and or getting light headed, or over 160/100 for several readings or 180/110 or more on any one reading     Make lifestyle changes that will lower your pressure  Sleep at least 7.5 hours a night  Get tested for obstructive sleep apnea if you have loud snoring and still feel sleepy after a good night's sleep  Limit alcohol to no more than 1 a day  Heart healthy diet such as the Mediterranean or DASH diet   Milled flax seed 30 grams a day  Stop any non steroidal antiinflammatory drugs such as ibuprofen (advil) or naprosyn (alleve)                  Chronic midline thoracic back pain  Referring to physical therapy     Decreased hearing of both ears  Referring to audiology.   Treating nasal congestion issues     1. Nasal saline irrigations - at least once daily: NeilMed Sinus Rinse or Neti Pot (available over the counter).  2. Flonase (fluticazone) [topical steroid] nasal spray - available over the counter.   Avoid if history of glaucoma.  2 sprays per nostril once daily for 2 weeks; if no benefit, increase to twice daily for 2 weeks.  Aim away from middle of nose (septum) when using.  If nose becomes dry, add over the counter saline nasal spray (eg Ocean spray) as needed.  Continue for at least 4- 6 weeks to determine if beneficial.

## 2022-08-27 NOTE — Unmapped (Signed)
Rechecking urine with culture and for sexually transmitted infection.

## 2022-10-08 ENCOUNTER — Ambulatory Visit: Payer: PRIVATE HEALTH INSURANCE

## 2022-10-17 ENCOUNTER — Encounter: Admit: 2022-10-17 | Discharge: 2022-10-17 | Payer: PRIVATE HEALTH INSURANCE

## 2022-10-17 DIAGNOSIS — M546 Pain in thoracic spine: Secondary | ICD-10-CM

## 2022-10-17 DIAGNOSIS — G8929 Other chronic pain: Secondary | ICD-10-CM

## 2022-10-17 NOTE — Unmapped (Signed)
Physical Therapy Initial Evaluation and Plan of Care    Name: Devin Miller     Date of Birth: July 23, 1996      MRN: [id number]    Date of evaluation: 10/17/2022  Referring Physician: Alessandra Grout, MD  [place]  [address]  [place],   [address] Phone: [phone] Fax: [phone]  Specific Order:  chronic mid-back thoracic pain - eval and treat   Primary Medical Diagnosis:   1. Chronic midline thoracic back pain  AMB Follow-up to Physical Therapy        Insurance: Payor: ANTHEM / Plan: BLUE ACCESS / Product Type: PPO /      Subjective/History:  Date of Onset: (worsening mid-back pain over past 62yrs)  History of Current Problem/Reason for Referral: Devin Miller is a 27 y.o. male who presents today with chief complaint of pain while he is playing his double bass    Past Medical History:   Diagnosis Date    DVT (deep venous thrombosis) (CMS-HCC) DX 2018    Obesity    No past surgical history on file.  Medications: reviewed in EMR    Prior Treatment: rest, heat, acetaminophen, NSAIDS  Learning Assessment: Patient is able to communicate with therapist and verbalize understanding of directions/instructions        Work / School Status:  (he is full-time Ship broker at [employer])        10/17/2022   Pain Assessment   Area 1 mid-back / b/t scapula    Now (0-10/10) 2   Worst in last 30 days 7   Best in last 30 days 0   Quality intermittent;aching;burning;sharp   Symptom Frequency % happens during his playing of double bass   Relieved by: rest;heat         10/17/2022   Current/Prior Level of Function   Cooking Current: independent;modified   Cleaning Current: independent;modified   Grocery shopping Current: independent   Laundry Current: independent   Bathing Current: independent   Dressing Current: independent   Toileting Current: independent       Precautions/contraindications: none          Objective:      10/17/2022   Posture: within functional limits or tested  as follows:   Head forward   Shoulder forward   Scapula protracted right;protracted left   Thoracic kyphosis increased   Lumbar lordosis decreased         10/17/2022   Functional Movement Screen: within functional limits or tested as follows   Thoraco-Lumbar Flexion Functional Non-painful   Thoraco-Lumbar Extension Functional Non-painful   Thoraco-Lumbar Rotation Functional Non-painful         10/17/2022   Range of Motion: within functional limits or tested as follows:   Lumbar Flexion 78   Lumbar Extension 30   Lumbar Right Rotation 30   Lumbar Left Rotation 30   Lumbar Right Side Bend 25   Lumbar Left Side Bend 24         10/17/2022   Strength Eval: within functional limits or tested as follows:   Shoulder Flexion Left 4+   Shoulder Flexion Right 4+   Shoulder Abduction Left 5-   Shoulder Abduction Right 5-   Shoulder Extension Left 5-   Shoulder Extension Right 5-   Shoulder Internal Rotation Left 5   Shoulder Internal Rotation Right 5   Shoulder External Rotation Left 4+   Shoulder External Rotation Right 4+   Middle Trapezius  Left 4   Middle Trapezius right 4   Lower Trapezius Left 4-   Lower Trapezius Right 4-   Elbow Flexion Left 5   Elbow Flexion Right 5   Wrist Flexion Left 5   Wrist Flexion Right 5   Wrist Extension Left 5   Wrist Extension Right 5   Grip Strength Left 5   Grip Strength Right 5         10/17/2022   Flexibility   Pec Minor: Right supine, measured AC to surface in cm 7cm   Pec Minor: Left supine, measured AC to surface in cm 7cm         10/17/2022   Additional Testing Information: within functional limits or tested as follows:   Sensation intact/normal   Proprioception WNL   Skin Integrity WNL   Edema yes   Palpation minimal TTP over mid-back / interscapular  PVM's   Stair Management Techniques WNL   Alignment: Iliac Crest Left WFL   Alignment: Iliac Crest Right WFL   Alignment: PSIS Left WFL   Alignment: PSIS Right WFL       Standardized Tests:   MODI  3/50          Assessment:  Patient presents  with Decreased strength, Decreased A/P flexibility, Presents with pain contributing to functional limitations Decreased ability to reach/lift/carry, Decreased tolerance for ADL's/ work demands. Patient would benefit from skilled PT to address the aforementioned deficits. Patient/family received education on the purpose of therapy, participated in the development of the POC and verbalized understanding and agreement of POC, goals.  Rehabilitation Potential: Based on this therapist's assessment, Sevin Camp Gopal has Good rehabilitation potential for the PT goals stated below:           10/17/2022   Therapy Goals   Goal: Patient will report improvement  in WORST reported pain to demonstrate improved pain management allowing for increased functional mobility/ tolerance. Not met   Goal: Patient demonstrates functional, painfree UE strength to restore PLOF and promote pain management. Not met   Goal: Patient demonstrates functional core strength to optimize performance of ADL's at PLOF. Not met   Goal:  improved MODI score to 0/50 MODI at IE 3/50   Goal: Patient demonstrates independence with HEP through demonstration/ teach back review to maintain benefits gained in therapy Not met       Plan:    Treatment to include: Therapeutic Exercise: 97110, Therapeutic Activities: 97530, Electrical Stimulation/unattended: 16109, Manual Therapy: 97140, PT Low Complexity Eval: 97161, ROM, Extremity/Spine: 95851, MMT, Total Body Excluding Hands: 95833, Dry Needling 3 or More Muscles: 20561   Patient/Family participated in the development of POC and verbalized understanding and agreement of POC, goals and treatment: Patient/Family agrees with treatment: Yes   Treatment Diagnosis: Back pain  M54.9    Therapy Frequency/Duration: Patient to receive skilled PT services up to 2x/week times per week for up to 4 weeks   or Total number of visits: 8v starting from the first scheduled follow up visit within this plan of  care.      Certification Period: 10/17/2022 - 90 days    Therapist Signature: Clent Demark, PT  Date: 10/17/2022    Physician Certification  I certify that the above patient is under my care and requires the above services. These professional services are to be provided from an established plan, related to the diagnosis and reviewed by me every 90 days.     Additional comments/revisions:  Physician Name: Izora Gala, MD    Signature_______________________________________ Date: _______        Physical Therapy Daily Treatment Note    Primary Treatment Diagnosis:   1. Chronic midline thoracic back pain  AMB Follow-up to Physical Therapy        Insurance plan:   Payer/Plan Subscr DOB Sex Relation Sub. Ins. ID Effective Group Num   1. ANTHEM ABEM, SHADDIX 09/30/1869 Male Child [id number] [birth year] [id number]                                   [address]                         # of visits per insurance authorization: 25v  # of visits per POC: 8v     Date of Initial Eval: 10/17/2022    Verified Signed POC:    []  YES  []   NO   Date:    Verified Signed POC:    []  YES  []   NO   Date:    Verified Signed POC:    []  YES  []   NO   Date:    Verified Signed POC:    []  YES  []   NO   Date:    Verified Signed POC:    []  YES  []   NO   Date:    Verified Signed POC:    []  YES  []   NO   Date:      Follow up Message to Referral Source for signature via   InBasket/ EMR [x]      Fax []        Mail []          Date:   10/17/22    Routed to PCP (if other than referring) for signature via   EMR []  Fax []    Mail []   Date:    Was POC updated?     []   YES []   NO    Date:        If yes, Verified Signed POC:    []   YES  []   NO   Date:         Verified Signed POC:    []  YES  []   NO   Date:         Verified Signed POC:    []  YES  []   NO   Date:      Date of most current POC : 10/17/2022  (in this Episode of Care)    Late appiontments       Date  Minutes late  Attendance policy reviewed?                                    PRIMARY  THERAPIST: Guila Owensby,PT         PT DAILY TREATMENT NOTE    Subjective:  ... I play the double bass at [employer] / and over past couple years I've been having more mid-back pain - especially while I play....    Objective:  EVALUATION COMPLETED TODAY. SEE EVAL DOCS FOR DETAILS      Therapeutic exercises performed today are noted in the log below.  They have been modified to suit current functional status and instructed for proper form to protect joint  surfaces and soft tissue while enhancing flexibility and strength.    Manual techniques were used today as indicated to improve resting muscle tone and flexibility in muscle groups that impact pain perception and active motion.    Exercise Log:  VISIT 1 2 3 4    Date  10/17/2022      Pain Report Mid-back pain 5/10      MODI 3/50      Evaluation/ Reevaluation  low complexity completed  xx xx xx   Therapeutic exercise        Doorway str H10x3      Lateral trunk flexion Standing  H5x5 ea      Seated T/L rotation R/L  H5x3ea             Scap retract Blue TB   H3x8      pulldowns Blue TB  H3x8      No monies  Blue TB  H3x8      Core engagement Seated / standing  H5x5;    Cuing to maintain engagement during activities       Bird-dog At wall   R/L  H10x3 ea             Prone I / T / Y's ---                    UBE L-3  2'F/2'R      Neuro re-ed                             Manual therapy                             Therapeutic activities                             Modalities                                 Patient Education:10/17/2022:  patient received education on the purpose of therapy, participated in the development of the POC and verbalized understanding and agreement of POC, goals.    Assessment:  see POC for details    Plan:  1-2x/week x 4 weeks / ANTHEM - BLUE ACCESS    This note serves as a Discharge Summary should the patient not return to physical therapy per the above plan of care.       MINUTES of TREATMENT   Evaluation/ Re evaluation low complexity  completed / 15   Therapeutic Exercise 38   Therapeutic Activity    Gait Training    Manual Therapy    Iontophoresis    Ultrasound    Electrical Stimulation    Neuromuscular Rehab     Ice/ Heat    Total Treatment Time 53

## 2022-10-23 ENCOUNTER — Encounter: Admit: 2022-10-23 | Discharge: 2022-10-23 | Payer: PRIVATE HEALTH INSURANCE

## 2022-10-23 DIAGNOSIS — G8929 Other chronic pain: Secondary | ICD-10-CM

## 2022-10-23 NOTE — Unmapped (Signed)
Physical Therapy Progress Note    Name: Devin Miller     Date of Birth: [birth year]      MRN: [id number]    Date of evaluation: 10/17/2022  Referring Physician: Izora Gala, MD  [phone]  [name], [address]  [place],  [place] [address] Phone: [phone] Fax: [phone]  Specific Order:  chronic mid-back thoracic pain - eval and treat   Primary Medical Diagnosis:   1. Chronic midline thoracic back pain          Insurance: Payor: ANTHEM / Plan: BLUE ACCESS / Product Type: PPO /      Subjective/History:  Date of Onset:   History of Current Problem/Reason for Referral: Devin Miller is a 27 y.o. male who presents today with chief complaint of      Past Medical History:   Diagnosis Date    DVT (deep venous thrombosis) (CMS-HCC) DX 2018    Obesity    No past surgical history on file.  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   Relieved by: rest;heat         10/17/2022   Current/Prior Level of Function   Cooking Current: independent;modified   Cleaning Current: independent;modified   Grocery shopping Current: independent   Laundry Current: independent   Bathing Current: independent   Dressing Current: independent   Toileting Current: independent       No data recorded          Objective:      10/17/2022   Posture: within functional limits or tested as follows:   Head forward   Shoulder forward   Scapula protracted right;protracted left   Thoracic kyphosis increased   Lumbar lordosis decreased         10/17/2022   Functional Movement Screen: within functional limits or tested as follows   Thoraco-Lumbar Flexion Functional Non-painful   Thoraco-Lumbar Extension Functional Non-painful   Thoraco-Lumbar Rotation Functional Non-painful          10/17/2022   Range of Motion: within functional limits or tested as follows:   Lumbar Flexion 78   Lumbar Extension 30   Lumbar Right Rotation 30   Lumbar Left Rotation 30   Lumbar Right Side Bend 25   Lumbar Left Side Bend 24         10/17/2022   Strength Eval: within functional limits or tested as follows:   Shoulder Flexion Left 4+   Shoulder Flexion Right 4+   Shoulder Abduction Left 5-   Shoulder Abduction Right 5-   Shoulder Extension Left 5-   Shoulder Extension Right 5-   Shoulder Internal Rotation Left 5   Shoulder Internal Rotation Right 5   Shoulder External Rotation Left 4+   Shoulder External Rotation Right 4+   Middle Trapezius Left 4   Middle Trapezius right 4   Lower Trapezius Left 4-   Lower Trapezius Right 4-   Elbow Flexion Left 5   Elbow Flexion Right 5   Wrist Flexion Left 5   Wrist Flexion Right 5   Wrist  Extension Left 5   Wrist Extension Right 5   Grip Strength Left 5   Grip Strength Right 5         10/17/2022   Flexibility   Pec Minor: Right supine, measured AC to surface in cm 7cm   Pec Minor: Left supine, measured AC to surface in cm 7cm         10/17/2022   Additional Testing Information: within functional limits or tested as follows:   Sensation intact/normal   Proprioception WNL   Skin Integrity WNL   Edema yes   Palpation minimal TTP over mid-back / interscapular  PVM's   Stair Management Techniques WNL   Alignment: Iliac Crest Left WFL   Alignment: Iliac Crest Right WFL   Alignment: PSIS Left WFL   Alignment: PSIS Right WFL       Standardized Tests:   MODI  3/50          Assessment:  Patient presents with   contributing to functional limitations  . Patient would benefit from skilled PT to address the aforementioned deficits. Patient/family received education on the purpose of therapy, participated in the development of the POC and verbalized understanding and agreement of POC, goals.  Rehabilitation Potential: Based on this therapist's assessment, Devin Miller has    rehabilitation potential for the PT goals stated below:           10/17/2022   Therapy Goals   Goal: Patient will report improvement  in WORST reported pain to demonstrate improved pain management allowing for increased functional mobility/ tolerance. Not met   Goal: Patient demonstrates functional, painfree UE strength to restore PLOF and promote pain management. Not met   Goal: Patient demonstrates functional core strength to optimize performance of ADL's at PLOF. Not met   Goal:  improved MODI score to 0/50 MODI at IE 3/50   Goal: Patient demonstrates independence with HEP through demonstration/ teach back review to maintain benefits gained in therapy Not met       Plan:        Patient/Family participated in the development of POC and verbalized understanding and agreement of POC, goals and treatment:          Therapy Frequency/Duration: Patient to receive skilled PT services up to   times per week for up to     or   starting from the first scheduled follow up visit within this plan of care.      Certification Period: 10/17/2022 - 90 days    Therapist Signature: Clent Demark, PT  Date: 10/17/2022    Physician Certification  I certify that the above patient is under my care and requires the above services. These professional services are to be provided from an established plan, related to the diagnosis and reviewed by me every 90 days.     Additional comments/revisions:       Physician Name: Izora Gala, MD    Signature_______________________________________ Date: _______        Physical Therapy Daily Treatment Note    Primary Treatment Diagnosis:   1. Chronic midline thoracic back pain          Insurance plan:   Payer/Plan Subscr DOB Sex Relation Sub. Ins. ID Effective Group Num   1. Devin Miller, Devin Miller 09/30/1869 Male Child [id number] [birth year] [id number]  [id number]                         # of visits per insurance authorization: 25v  # of visits per POC: 8v      Date of Initial Eval: 10/17/2022    Verified Signed POC:    [x]  YES  []   NO   Date:   10/17/22 - Dr Alessandra Grout  Verified Signed POC:    []  YES  []   NO   Date:    Verified Signed POC:    []  YES  []   NO   Date:    Verified Signed POC:    []  YES  []   NO   Date:    Verified Signed POC:    []  YES  []   NO   Date:    Verified Signed POC:    []  YES  []   NO   Date:      Follow up Message to Referral Source for signature via   InBasket/ EMR [x]      Fax []        Mail []          Date:   10/17/22    Routed to PCP (if other than referring) for signature via   EMR []  Fax []    Mail []   Date:    Was POC updated?     []   YES []   NO    Date:        If yes, Verified Signed POC:    []   YES  []   NO   Date:         Verified Signed POC:    []  YES  []   NO   Date:         Verified Signed POC:    []  YES  []   NO   Date:      Date of most current POC : 10/17/2022  (in this Episode of Care)    Late appiontments       Date  Minutes late  Attendance policy reviewed?                                    PRIMARY THERAPIST: Yesena Reaves,PT         PT DAILY TREATMENT NOTE    Subjective:  ... I have to remember to suck in my stomach while I'm playing and it helps alleviate the upper back pain (mid-back)... trying to get thru the exercises every day...    Objective:  Treatment per flow sheet - light cardio on UBE followed by his execution of original HEP therex regimen - needed min-moderate correctional cuing / instructions for proper therex technique.        Therapeutic exercises performed today are noted in the log below.  They have been modified to suit current functional status and instructed for proper form to protect joint surfaces and soft tissue while enhancing flexibility and strength.    Manual techniques were used today as indicated to improve resting muscle tone and flexibility in muscle groups that impact pain perception and active motion.    Exercise Log:  VISIT 1 2 3 4    Date  10/17/2022 10/23/22     Pain Report Mid-back pain 5/10  Mid-back 0-1/10     MODI 3/50      Evaluation/ Reevaluation  low complexity completed  xx xx xx   Therapeutic exercise        Doorway str H10x3 H10 x4     Lateral trunk flexion Standing  H5x5 ea Standing  R/L  H10 x3 ea     Seated T/L rotation R/L  H5x3ea R/L  H5x3 ea            Scap retract Blue TB   H3x8 Blue TB  H5x8     pulldowns Blue TB  H3x8 Blue TB  H5x8     No monies  Blue TB  H3x8 Blue TB  H5x8     Core engagement Seated / standing  H5x5;    Cuing to maintain engagement during activities  Standing / sitting  Frequent cuing to maintain core engagement during TE     Bird-dog At wall   R/L  H10x3 ea At wall   R/L  H10 x5 ea            Prone I's / goalpost --- H5x5ea                   UBE L-3  2'F/2'R L-3  3'F/3'R     Neuro re-ed                             Manual therapy                             Therapeutic activities                             Modalities                                 Patient Education:10/17/2022:  patient received education on the purpose of therapy, participated in the development of the POC and verbalized understanding and agreement of POC, goals.    Assessment:  He seems to be pleased with his progress / response to therapeutic interventions - no new complaints or concerns offered this visit.     Plan:   Continue expanding therex challenges  / ANTHEM - BLUE ACCESS    This note serves as a Discharge Summary should the patient not return to physical therapy per the above plan of care.       MINUTES of TREATMENT   Evaluation/ Re evaluation    Therapeutic Exercise 38   Therapeutic Activity    Gait Training    Manual Therapy    Iontophoresis    Ultrasound    Electrical Stimulation    Neuromuscular Rehab     Ice/ Heat    Total Treatment Time 38

## 2022-10-25 ENCOUNTER — Encounter: Admit: 2022-10-25 | Discharge: 2022-10-25 | Payer: PRIVATE HEALTH INSURANCE

## 2022-10-25 DIAGNOSIS — G8929 Other chronic pain: Secondary | ICD-10-CM

## 2022-10-25 NOTE — Unmapped (Signed)
Physical Therapy Progress Note    Name: Devin Miller     Date of Birth: [birth year]      MRN: [id number]    Date of evaluation: 10/17/2022  Referring Physician: Izora Gala, MD  [phone]  [name], [address]rd floor  [place],  [place] [address] Phone: [phone] Fax: [phone]  Specific Order:  chronic mid-back thoracic pain - eval and treat   Primary Medical Diagnosis:   1. Chronic midline thoracic back pain          Insurance: Payor: ANTHEM / Plan: BLUE ACCESS / Product Type: PPO /      Subjective/History:  Date of Onset:   History of Current Problem/Reason for Referral: Devin Miller is a 27 y.o. male who presents today with chief complaint of      Past Medical History:   Diagnosis Date    DVT (deep venous thrombosis) (CMS-HCC) DX 2018    Obesity    No past surgical history on file.  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   Relieved by: rest;heat         10/17/2022   Current/Prior Level of Function   Cooking Current: independent;modified   Cleaning Current: independent;modified   Grocery shopping Current: independent   Laundry Current: independent   Bathing Current: independent   Dressing Current: independent   Toileting Current: independent       No data recorded          Objective:      10/17/2022   Posture: within functional limits or tested as follows:   Head forward   Shoulder forward   Scapula protracted right;protracted left   Thoracic kyphosis increased   Lumbar lordosis decreased         10/17/2022   Functional Movement Screen: within functional limits or tested as follows   Thoraco-Lumbar Flexion Functional Non-painful   Thoraco-Lumbar Extension Functional Non-painful   Thoraco-Lumbar Rotation Functional Non-painful          10/17/2022   Range of Motion: within functional limits or tested as follows:   Lumbar Flexion 78   Lumbar Extension 30   Lumbar Right Rotation 30   Lumbar Left Rotation 30   Lumbar Right Side Bend 25   Lumbar Left Side Bend 24         10/17/2022   Strength Eval: within functional limits or tested as follows:   Shoulder Flexion Left 4+   Shoulder Flexion Right 4+   Shoulder Abduction Left 5-   Shoulder Abduction Right 5-   Shoulder Extension Left 5-   Shoulder Extension Right 5-   Shoulder Internal Rotation Left 5   Shoulder Internal Rotation Right 5   Shoulder External Rotation Left 4+   Shoulder External Rotation Right 4+   Middle Trapezius Left 4   Middle Trapezius right 4   Lower Trapezius Left 4-   Lower Trapezius Right 4-   Elbow Flexion Left 5   Elbow Flexion Right 5   Wrist Flexion Left 5   Wrist Flexion Right 5   Wrist  Extension Left 5   Wrist Extension Right 5   Grip Strength Left 5   Grip Strength Right 5         10/17/2022   Flexibility   Pec Minor: Right supine, measured AC to surface in cm 7cm   Pec Minor: Left supine, measured AC to surface in cm 7cm         10/17/2022   Additional Testing Information: within functional limits or tested as follows:   Sensation intact/normal   Proprioception WNL   Skin Integrity WNL   Edema yes   Palpation minimal TTP over mid-back / interscapular  PVM's   Stair Management Techniques WNL   Alignment: Iliac Crest Left WFL   Alignment: Iliac Crest Right WFL   Alignment: PSIS Left WFL   Alignment: PSIS Right WFL       Standardized Tests:   MODI  3/50          Assessment:  Patient presents with   contributing to functional limitations  . Patient would benefit from skilled PT to address the aforementioned deficits. Patient/family received education on the purpose of therapy, participated in the development of the POC and verbalized understanding and agreement of POC, goals.  Rehabilitation Potential: Based on this therapist's assessment, Devin Miller has    rehabilitation potential for the PT goals stated below:           10/17/2022   Therapy Goals   Goal: Patient will report improvement  in WORST reported pain to demonstrate improved pain management allowing for increased functional mobility/ tolerance. Not met   Goal: Patient demonstrates functional, painfree UE strength to restore PLOF and promote pain management. Not met   Goal: Patient demonstrates functional core strength to optimize performance of ADL's at PLOF. Not met   Goal:  improved MODI score to 0/50 MODI at IE 3/50   Goal: Patient demonstrates independence with HEP through demonstration/ teach back review to maintain benefits gained in therapy Not met       Plan:        Patient/Family participated in the development of POC and verbalized understanding and agreement of POC, goals and treatment:          Therapy Frequency/Duration: Patient to receive skilled PT services up to   times per week for up to     or   starting from the first scheduled follow up visit within this plan of care.      Certification Period: 10/17/2022 - 90 days    Therapist Signature: Clent Demark, PT  Date: 10/17/2022    Physician Certification  I certify that the above patient is under my care and requires the above services. These professional services are to be provided from an established plan, related to the diagnosis and reviewed by me every 90 days.     Additional comments/revisions:       Physician Name: Izora Gala, MD    Signature_______________________________________ Date: _______        Physical Therapy Daily Treatment Note    Primary Treatment Diagnosis:   1. Chronic midline thoracic back pain          Insurance plan:   Payer/Plan Subscr DOB Sex Relation Sub. Ins. ID Effective Group Num   1. Devin Miller 09/30/1869 Male Child [id number] [birth year] [id number]  [id number]                         # of visits per insurance authorization: 25v  # of visits per POC: 8v      Date of Initial Eval: 10/17/2022    Verified Signed POC:    [x]  YES  []   NO   Date:   10/17/22 - Dr Alessandra Grout  Verified Signed POC:    []  YES  []   NO   Date:    Verified Signed POC:    []  YES  []   NO   Date:    Verified Signed POC:    []  YES  []   NO   Date:    Verified Signed POC:    []  YES  []   NO   Date:    Verified Signed POC:    []  YES  []   NO   Date:      Follow up Message to Referral Source for signature via   InBasket/ EMR [x]      Fax []        Mail []          Date:   10/17/22    Routed to PCP (if other than referring) for signature via   EMR []  Fax []    Mail []   Date:    Was POC updated?     []   YES []   NO    Date:        If yes, Verified Signed POC:    []   YES  []   NO   Date:         Verified Signed POC:    []  YES  []   NO   Date:         Verified Signed POC:    []  YES  []   NO   Date:      Date of most current POC : 10/17/2022  (in this Episode of Care)    Late appiontments       Date  Minutes late  Attendance policy reviewed?    10/25/22 6                               PRIMARY THERAPIST: JENNIFER Miller,PT         PT DAILY TREATMENT NOTE    Subjective: Pt. Reports having no back pain today. Has been feeling better since starting HEP.   Objective:  Treatment per flow sheet     Therapeutic exercises performed today are noted in the log below.  They have been modified to suit current functional status and instructed for proper form to protect joint surfaces and soft tissue while enhancing flexibility and strength.    Manual techniques were used today as indicated to improve resting muscle tone and flexibility in muscle groups that impact pain perception and active motion.    Exercise Log:  VISIT 1 2 3 4    Date  10/17/2022 10/23/22 10/25/22    Pain Report Mid-back pain 5/10 Mid-back 0-1/10     MODI 3/50      Evaluation/ Reevaluation  low complexity completed  xx xx xx   Therapeutic exercise        Doorway str H10x3 H10 x4     Lateral trunk flexion Standing  H5x5 ea Standing  R/L  H10 x3 ea     Seated T/L  rotation R/L  H5x3ea R/L  H5x3 ea            Scap retract Blue TB   H3x8 Blue TB  H5x8     pulldowns Blue TB  H3x8 Blue TB  H5x8     No monies  Blue TB  H3x8 Blue TB  H5x8     Core engagement Seated / standing  H5x5;    Cuing to maintain engagement during activities  Standing / sitting  Frequent cuing to maintain core engagement during TE     Bird-dog At wall   R/L  H10x3 ea At wall   R/L  H10 x5 ea     Self mob   W/ strap  X 10    Prone W   X 20    Lock 3   X 20    Prone I's / goalpost --- H5x5ea     Cat camel   Quadruped  X 8 w/ 5H    Also discussed in sitting    Thread the needle   Quadruped  X 8 w/ 5H    UBE L-3  2'F/2'R L-3  3'F/3'R 3.0 2 min F/R    Neuro re-ed                             Manual therapy                             Therapeutic activities                             Modalities                                 Patient Education:10/17/2022:  patient received education on the purpose of therapy, participated in the development of the POC and verbalized understanding and agreement of POC, goals.    Assessment:  Pt. Presents to clinic w/ reduced pain since performing HEP. Added on to HEP to include mobility work to assist w/ improving movement patterns and reducing joint stiffness.     Plan:   Continue expanding therex challenges  / ANTHEM - BLUE ACCESS    This note serves as a Discharge Summary should the patient not return to physical therapy per the above plan of care.       MINUTES of TREATMENT   Evaluation/ Re evaluation    Therapeutic Exercise 24   Therapeutic Activity    Gait Training    Manual Therapy    Iontophoresis    Ultrasound    Electrical Stimulation    Neuromuscular Rehab     Ice/ Heat    Total Treatment Time 24

## 2022-10-30 ENCOUNTER — Encounter: Admit: 2022-10-30 | Discharge: 2022-10-30 | Payer: PRIVATE HEALTH INSURANCE

## 2022-10-30 DIAGNOSIS — G8929 Other chronic pain: Secondary | ICD-10-CM

## 2022-10-30 NOTE — Unmapped (Addendum)
Physical Therapy Progress Note    Name: Devin Miller     Date of Birth: 1995-10-08      MRN: [id number]    Date of evaluation: 10/17/2022  Referring Physician: Alessandra Grout, MD  [address]  [place],  [place] [address] Phone: [phone] Fax: [phone]  Specific Order:  chronic mid-back thoracic pain - eval and treat   Primary Medical Diagnosis:   1. Chronic midline thoracic back pain            Insurance: Payor: ANTHEM / Plan: BLUE ACCESS / Product Type: PPO /      Subjective/History:  Date of Onset:   History of Current Problem/Reason for Referral: Devin Miller is a 27 y.o. male who presents today with chief complaint of      Past Medical History:   Diagnosis Date    DVT (deep venous thrombosis) (CMS-HCC) DX 2018    Obesity    No past surgical history on file.  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   Relieved by: rest;heat         10/17/2022   Current/Prior Level of Function   Cooking Current: independent;modified   Cleaning Current: independent;modified   Grocery shopping Current: independent   Laundry Current: independent   Bathing Current: independent   Dressing Current: independent   Toileting Current: independent       No data recorded          Objective:      10/17/2022   Posture: within functional limits or tested as follows:   Head forward   Shoulder forward   Scapula protracted right;protracted left   Thoracic kyphosis increased   Lumbar lordosis decreased         10/17/2022   Functional Movement Screen: within functional limits or tested as follows   Thoraco-Lumbar Flexion Functional Non-painful   Thoraco-Lumbar Extension Functional Non-painful   Thoraco-Lumbar Rotation Functional Non-painful          10/17/2022   Range of Motion: within functional limits or tested as follows:   Lumbar Flexion 78   Lumbar Extension 30   Lumbar Right Rotation 30   Lumbar Left Rotation 30   Lumbar Right Side Bend 25   Lumbar Left Side Bend 24         10/17/2022   Strength Eval: within functional limits or tested as follows:   Shoulder Flexion Left 4+   Shoulder Flexion Right 4+   Shoulder Abduction Left 5-   Shoulder Abduction Right 5-   Shoulder Extension Left 5-   Shoulder Extension Right 5-   Shoulder Internal Rotation Left 5   Shoulder Internal Rotation Right 5   Shoulder External Rotation Left 4+   Shoulder External Rotation Right 4+   Middle Trapezius Left 4   Middle Trapezius right 4   Lower Trapezius Left 4-   Lower Trapezius Right 4-   Elbow Flexion Left 5   Elbow Flexion Right 5   Wrist Flexion Left 5   Wrist Flexion Right 5  Wrist Extension Left 5   Wrist Extension Right 5   Grip Strength Left 5   Grip Strength Right 5         10/17/2022   Flexibility   Pec Minor: Right supine, measured AC to surface in cm 7cm   Pec Minor: Left supine, measured AC to surface in cm 7cm         10/17/2022   Additional Testing Information: within functional limits or tested as follows:   Sensation intact/normal   Proprioception WNL   Skin Integrity WNL   Edema yes   Palpation minimal TTP over mid-back / interscapular  PVM's   Stair Management Techniques WNL   Alignment: Iliac Crest Left WFL   Alignment: Iliac Crest Right WFL   Alignment: PSIS Left WFL   Alignment: PSIS Right WFL       Standardized Tests:   MODI  3/50          Assessment:  Patient presents with   contributing to functional limitations  . Patient would benefit from skilled PT to address the aforementioned deficits. Patient/family received education on the purpose of therapy, participated in the development of the POC and verbalized understanding and agreement of POC, goals.  Rehabilitation Potential: Based on this therapist's assessment, Devin Miller has    rehabilitation potential for the PT goals stated below:           10/17/2022   Therapy Goals   Goal: Patient will report improvement  in WORST reported pain to demonstrate improved pain management allowing for increased functional mobility/ tolerance. Not met   Goal: Patient demonstrates functional, painfree UE strength to restore PLOF and promote pain management. Not met   Goal: Patient demonstrates functional core strength to optimize performance of ADL's at PLOF. Not met   Goal:  improved MODI score to 0/50 MODI at IE 3/50   Goal: Patient demonstrates independence with HEP through demonstration/ teach back review to maintain benefits gained in therapy Not met       Plan:        Patient/Family participated in the development of POC and verbalized understanding and agreement of POC, goals and treatment:          Therapy Frequency/Duration: Patient to receive skilled PT services up to   times per week for up to     or   starting from the first scheduled follow up visit within this plan of care.      Certification Period: 10/17/2022 - 90 days    Therapist Signature: Clent Demark, PT  Date: 10/17/2022    Physician Certification  I certify that the above patient is under my care and requires the above services. These professional services are to be provided from an established plan, related to the diagnosis and reviewed by me every 90 days.     Additional comments/revisions:       Physician Name: Izora Gala, MD    Signature_______________________________________ Date: _______        Physical Therapy Daily Treatment Note    Primary Treatment Diagnosis:   1. Chronic midline thoracic back pain            Insurance plan:   Payer/Plan Subscr DOB Sex Relation Sub. Ins. ID Effective Group Num   1. JARRICK, FJELD 09/30/1869 Male Child [id number] [birth year] [id number]  [id number]                         # of visits per insurance authorization: 25v  # of visits per POC: 8v      Date of Initial Eval: 10/17/2022    Verified Signed POC:    [x]  YES  []   NO   Date:   10/17/22 - Dr Alessandra Grout  Verified Signed POC:    []  YES  []   NO   Date:    Verified Signed POC:    []  YES  []   NO   Date:    Verified Signed POC:    []  YES  []   NO   Date:    Verified Signed POC:    []  YES  []   NO   Date:    Verified Signed POC:    []  YES  []   NO   Date:      Follow up Message to Referral Source for signature via   InBasket/ EMR [x]      Fax []        Mail []          Date:   10/17/22    Routed to PCP (if other than referring) for signature via   EMR []  Fax []    Mail []   Date:    Was POC updated?     []   YES []   NO    Date:        If yes, Verified Signed POC:    []   YES  []   NO   Date:         Verified Signed POC:    []  YES  []   NO   Date:         Verified Signed POC:    []  YES  []   NO   Date:      Date of most current POC : 10/17/2022  (in this Episode of Care)    Late appiontments       Date  Minutes late  Attendance policy reviewed?    10/25/22 6                               PRIMARY THERAPIST: JENNIFER ADKINS,PT         PT DAILY TREATMENT NOTE    Subjective: Pt. Reports having no back pain today. Has been feeling better since starting HEP.   Objective:  Treatment per flow sheet     Therapeutic exercises performed today are noted in the log below.  They have been modified to suit current functional status and instructed for proper form to protect joint surfaces and soft tissue while enhancing flexibility and strength.    Manual techniques were used today as indicated to improve resting muscle tone and flexibility in muscle groups that impact pain perception and active motion.    Exercise Log:  VISIT 1 2 3 4    Date  10/17/2022 10/23/22 10/25/22 10/30/22   Pain Report Mid-back pain 5/10 Mid-back 0-1/10     MODI 3/50      Evaluation/ Reevaluation  low complexity completed  xx xx xx   Therapeutic exercise        Doorway str H10x3 H10 x4     Lateral trunk flexion Standing  H5x5 ea Standing  R/L  H10 x3 ea     Seated  T/L rotation R/L  H5x3ea R/L  H5x3 ea            Scap retract Blue TB   H3x8 Blue TB  H5x8     pulldowns Blue TB  H3x8 Blue TB  H5x8     No monies  Blue TB  H3x8 Blue TB  H5x8     Core engagement Seated / standing  H5x5;    Cuing to maintain engagement during activities  Standing / sitting  Frequent cuing to maintain core engagement during TE     Bird-dog At wall   R/L  H10x3 ea At wall   R/L  H10 x5 ea     Face pull    Yellow tube x 20  Red tube x 20   Wall angel    Resisted   Yellow x 20  Red x 20   Bent over row    10# x 20   Chop    Blue and yellow tube  X 20   Lift    Red   X 20   Self mob   W/ strap  X 10    Prone W   X 20    Lock 3   X 20    Prone I's / goalpost --- H5x5ea     Cat camel   Quadruped  X 8 w/ 5H    Also discussed in sitting    Thread the needle   Quadruped  X 8 w/ 5H    UBE L-3  2'F/2'R L-3  3'F/3'R 3.0 2 min F/R 3.0 2 min F/R   Neuro re-ed                             Manual therapy                             Therapeutic activities                             Modalities                                 Patient Education:10/17/2022:  patient received education on the purpose of therapy, participated in the development of the POC and verbalized understanding and agreement of POC, goals.    Assessment:  Pt. Continues to present to clinic w/ improved symptoms. TherEx was utilized once more to continue strengthening posterior chain to reduce muscle imbalances.   Plan:   Continue expanding therex challenges  / ANTHEM - BLUE ACCESS    This note serves as a Discharge Summary should the patient not return to physical therapy per the above plan of care.       MINUTES of TREATMENT   Evaluation/ Re evaluation    Therapeutic Exercise 29   Therapeutic Activity    Gait Training    Manual Therapy    Iontophoresis    Ultrasound    Electrical Stimulation    Neuromuscular Rehab     Ice/ Heat    Total Treatment Time 29

## 2022-11-08 ENCOUNTER — Encounter: Admit: 2022-11-08 | Discharge: 2022-11-08 | Payer: PRIVATE HEALTH INSURANCE

## 2022-11-08 DIAGNOSIS — G8929 Other chronic pain: Secondary | ICD-10-CM

## 2022-11-08 DIAGNOSIS — M546 Pain in thoracic spine: Secondary | ICD-10-CM

## 2022-11-08 NOTE — Progress Notes (Signed)
Physical Therapy Progress Note    Name: Dijon Cosens     Date of Birth: 1995-10-08      MRN: [id number]    Date of evaluation: 10/17/2022  Referring Physician: Alessandra Grout, MD  [facility]  [address]  [place],  [place] [address] Phone: [phone] Fax: [phone]  Specific Order:  chronic mid-back thoracic pain - eval and treat   Primary Medical Diagnosis:   1. Chronic midline thoracic back pain            Insurance: Payor: ANTHEM / Plan: BLUE ACCESS / Product Type: PPO /      Subjective/History:  Date of Onset:   History of Current Problem/Reason for Referral: Rahil Artavis Cowie is a 27 y.o. male who presents today with chief complaint of      Past Medical History:   Diagnosis Date    DVT (deep venous thrombosis) (CMS-HCC) DX 2018    Obesity    No past surgical history on file.  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   Relieved by: rest;heat         10/17/2022   Current/Prior Level of Function   Cooking Current: independent;modified   Cleaning Current: independent;modified   Grocery shopping Current: independent   Laundry Current: independent   Bathing Current: independent   Dressing Current: independent   Toileting Current: independent       No data recorded          Objective:      10/17/2022   Posture: within functional limits or tested as follows:   Head forward   Shoulder forward   Scapula protracted right;protracted left   Thoracic kyphosis increased   Lumbar lordosis decreased         10/17/2022   Functional Movement Screen: within functional limits or tested as follows   Thoraco-Lumbar Flexion Functional Non-painful   Thoraco-Lumbar Extension Functional Non-painful   Thoraco-Lumbar Rotation Functional Non-painful          10/17/2022   Range of Motion: within functional limits or tested as follows:   Lumbar Flexion 78   Lumbar Extension 30   Lumbar Right Rotation 30   Lumbar Left Rotation 30   Lumbar Right Side Bend 25   Lumbar Left Side Bend 24         10/17/2022   Strength Eval: within functional limits or tested as follows:   Shoulder Flexion Left 4+   Shoulder Flexion Right 4+   Shoulder Abduction Left 5-   Shoulder Abduction Right 5-   Shoulder Extension Left 5-   Shoulder Extension Right 5-   Shoulder Internal Rotation Left 5   Shoulder Internal Rotation Right 5   Shoulder External Rotation Left 4+   Shoulder External Rotation Right 4+   Middle Trapezius Left 4   Middle Trapezius right 4   Lower Trapezius Left 4-   Lower Trapezius Right 4-   Elbow Flexion Left 5   Elbow Flexion Right 5   Wrist Flexion Left 5   Wrist Flexion Right 5  Wrist Extension Left 5   Wrist Extension Right 5   Grip Strength Left 5   Grip Strength Right 5         10/17/2022   Flexibility   Pec Minor: Right supine, measured AC to surface in cm 7cm   Pec Minor: Left supine, measured AC to surface in cm 7cm         10/17/2022   Additional Testing Information: within functional limits or tested as follows:   Sensation intact/normal   Proprioception WNL   Skin Integrity WNL   Edema yes   Palpation minimal TTP over mid-back / interscapular  PVM's   Stair Management Techniques WNL   Alignment: Iliac Crest Left WFL   Alignment: Iliac Crest Right WFL   Alignment: PSIS Left WFL   Alignment: PSIS Right WFL       Standardized Tests:   MODI  3/50          Assessment:  Patient presents with   contributing to functional limitations  . Patient would benefit from skilled PT to address the aforementioned deficits. Patient/family received education on the purpose of therapy, participated in the development of the POC and verbalized understanding and agreement of POC, goals.  Rehabilitation Potential: Based on this therapist's assessment, Abdi Gilad Dugger has    rehabilitation potential for the PT goals stated below:           10/17/2022   Therapy Goals   Goal: Patient will report improvement  in WORST reported pain to demonstrate improved pain management allowing for increased functional mobility/ tolerance. Not met   Goal: Patient demonstrates functional, painfree UE strength to restore PLOF and promote pain management. Not met   Goal: Patient demonstrates functional core strength to optimize performance of ADL's at PLOF. Not met   Goal:  improved MODI score to 0/50 MODI at IE 3/50   Goal: Patient demonstrates independence with HEP through demonstration/ teach back review to maintain benefits gained in therapy Not met       Plan:        Patient/Family participated in the development of POC and verbalized understanding and agreement of POC, goals and treatment:          Therapy Frequency/Duration: Patient to receive skilled PT services up to   times per week for up to     or   starting from the first scheduled follow up visit within this plan of care.      Certification Period: 10/17/2022 - 90 days    Therapist Signature: Clent Demark, PT  Date: 10/17/2022    Physician Certification  I certify that the above patient is under my care and requires the above services. These professional services are to be provided from an established plan, related to the diagnosis and reviewed by me every 90 days.     Additional comments/revisions:       Physician Name: Izora Gala, MD    Signature_______________________________________ Date: _______        Physical Therapy Daily Treatment Note    Primary Treatment Diagnosis:   1. Chronic midline thoracic back pain            Insurance plan:   Payer/Plan Subscr DOB Sex Relation Sub. Ins. ID Effective Group Num   1. NYLEN, CREQUE 09/30/1869 Male Child [id number] [birth year] [id number]  [id number]                         # of visits per insurance authorization: 25v  # of visits per POC: 8v      Date of Initial Eval: 10/17/2022    Verified Signed POC:    [x]  YES  []   NO   Date:   10/18/22 - Dr Alessandra Grout  Verified Signed POC:    []  YES  []   NO   Date:    Verified Signed POC:    []  YES  []   NO   Date:    Verified Signed POC:    []  YES  []   NO   Date:    Verified Signed POC:    []  YES  []   NO   Date:    Verified Signed POC:    []  YES  []   NO   Date:      Follow up Message to Referral Source for signature via   InBasket/ EMR [x]      Fax []        Mail []          Date:   10/17/22    Routed to PCP (if other than referring) for signature via   EMR []  Fax []    Mail []   Date:    Was POC updated?     []   YES []   NO    Date:        If yes, Verified Signed POC:    []   YES  []   NO   Date:         Verified Signed POC:    []  YES  []   NO   Date:         Verified Signed POC:    []  YES  []   NO   Date:      Date of most current POC : 10/17/2022  (in this Episode of Care)    Late appiontments       Date  Minutes late  Attendance policy reviewed?    10/25/22 6                               PRIMARY THERAPIST: Hilari Wethington,PT         PT DAILY TREATMENT NOTE    Subjective:   .... I've been working on keeping my stomach sucked in while I play - doing the exercises has been helpful for sure...     Objective:  Treatment per flow sheet       Therapeutic exercises performed today are noted in the log below.  They have been modified to suit current functional status and instructed for proper form to protect joint surfaces and soft tissue while enhancing flexibility and strength.    Manual techniques were used today as indicated to improve resting muscle tone and flexibility in muscle groups that impact pain perception and active motion.    Exercise Log:  VISIT 1 2 3 4 5    Date  10/17/2022 10/23/22 10/25/22 10/30/22 11/08/22   Pain Report Mid-back pain 5/10 Mid-back 0-1/10   Upper back    0/10   MODI 3/50       Evaluation/ Reevaluation  low complexity completed  xx xx xx    Therapeutic exercise         Doorway str H10x3 H10 x4  Lateral trunk flexion Standing  H5x5 ea Standing  R/L  H10 x3 ea      Seated T/L rotation R/L  H5x3ea R/L  H5x3 ea      Latissimus str - - - - R/L   H15x 2 ea           Scap retract Blue TB   H3x8 Blue TB  H5x8      pulldowns Blue TB  H3x8 Blue TB  H5x8      No monies  Blue TB  H3x8 Blue TB  H5x8              Core engagement Seated / standing  H5x5;    Cuing to maintain engagement during activities  Standing / sitting  Frequent cuing to maintain core engagement during TE   Occasional cuing to maintain core engagement during therex today   Bird-dog At wall   R/L  H10x3 ea At wall   R/L  H10 x5 ea      Face pull    Yellow tube x 20  Red tube x 20 Yellow H2x20;   Red  H2x20   Wall angel    Resisted   Yellow x 20  Red x 20    Bent over row    10# x 20 10#  R/L   H2 x2x10   Chop    Blue and yellow tube  X 20 DCC   13#  R/L  x15   Lift    Red   X 20 DDC  #13  R/L     Self mob   W/ strap  X 10     Prone W   X 20  x20   Lock 3   X 20  x20   Prone I's / goalpost --- H5x5ea   H5x10   Cat camel   Quadruped  X 8 w/ 5H    Also discussed in sitting     Thread the needle   Quadruped  X 8 w/ 5H     UBE L-3  2'F/2'R L-3  3'F/3'R 3.0 2 min F/R 3.0 2 min F/R 3.0  3'F/3'R   Neuro re-ed                                 Manual therapy                                 Therapeutic activities                                 Modalities                                     Patient Education:10/17/2022:  patient received education on the purpose of therapy, participated in the development of the POC and verbalized understanding and agreement of POC, goals.    Assessment:  He seems to have responded well to therapeutic interventions - he voiced no new complaints / concerns today.  Anticipate full rehab success at this point.    Plan:    Planning on discharge next visit  - can follow-up with PCP as needed or if symptoms return  / [facility] ACCESS  This note serves as a Discharge Summary should the patient not return to physical therapy per  the above plan of care.       MINUTES of TREATMENT   Evaluation/ Re evaluation    Therapeutic Exercise 38   Therapeutic Activity    Gait Training    Manual Therapy    Iontophoresis    Ultrasound    Electrical Stimulation    Neuromuscular Rehab     Ice/ Heat    Total Treatment Time 38

## 2022-11-23 ENCOUNTER — Encounter: Admit: 2022-11-23 | Discharge: 2022-11-23 | Payer: PRIVATE HEALTH INSURANCE

## 2022-11-23 DIAGNOSIS — G8929 Other chronic pain: Secondary | ICD-10-CM

## 2022-11-23 NOTE — Progress Notes (Signed)
DISCHARGE  NOTE   ///    Physical Therapy Progress Note    Name: Devin Miller     Date of Birth: [birth year]      MRN: [id number]    Date of evaluation: 10/17/2022  Referring Physician: Alessandra Grout, MD  [facility]  [address]  [place],  [place] [address] Phone: [phone] Fax: [phone]  Specific Order:  chronic mid-back thoracic pain - eval and treat   Primary Medical Diagnosis:   1. Chronic midline thoracic back pain            Insurance: Payor: ANTHEM / Plan: BLUE ACCESS / Product Type: PPO /      Subjective/History:  Date of Onset:   History of Current Problem/Reason for Referral: Devin Miller is a 27 y.o. male who presents today with chief complaint of      Past Medical History:   Diagnosis Date    DVT (deep venous thrombosis) (CMS-HCC) DX 2018    Obesity    No past surgical history on file.  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   Relieved by: rest;heat         10/17/2022   Current/Prior Level of Function   Cooking Current: independent;modified   Cleaning Current: independent;modified   Grocery shopping Current: independent   Laundry Current: independent   Bathing Current: independent   Dressing Current: independent   Toileting Current: independent       No data recorded          Objective:      10/17/2022   Posture: within functional limits or tested as follows:   Head forward   Shoulder forward   Scapula protracted right;protracted left   Thoracic kyphosis increased   Lumbar lordosis decreased         10/17/2022   Functional Movement Screen: within functional limits or tested as follows   Thoraco-Lumbar Flexion Functional Non-painful   Thoraco-Lumbar Extension Functional Non-painful   Thoraco-Lumbar Rotation  Functional Non-painful         10/17/2022   Range of Motion: within functional limits or tested as follows:   Lumbar Flexion 78   Lumbar Extension 30   Lumbar Right Rotation 30   Lumbar Left Rotation 30   Lumbar Right Side Bend 25   Lumbar Left Side Bend 24         10/17/2022   Strength Eval: within functional limits or tested as follows:   Shoulder Flexion Left 4+   Shoulder Flexion Right 4+   Shoulder Abduction Left 5-   Shoulder Abduction Right 5-   Shoulder Extension Left 5-   Shoulder Extension Right 5-   Shoulder Internal Rotation Left 5   Shoulder Internal Rotation Right 5   Shoulder External Rotation Left 4+   Shoulder External Rotation Right 4+   Middle Trapezius Left 4   Middle Trapezius right 4   Lower Trapezius Left 4-   Lower Trapezius Right 4-   Elbow Flexion Left 5   Elbow Flexion Right 5   Wrist Flexion  Left 5   Wrist Flexion Right 5   Wrist Extension Left 5   Wrist Extension Right 5   Grip Strength Left 5   Grip Strength Right 5         10/17/2022   Flexibility   Pec Minor: Right supine, measured AC to surface in cm 7cm   Pec Minor: Left supine, measured AC to surface in cm 7cm         10/17/2022   Additional Testing Information: within functional limits or tested as follows:   Sensation intact/normal   Proprioception WNL   Skin Integrity WNL   Edema yes   Palpation minimal TTP over mid-back / interscapular  PVM's   Stair Management Techniques WNL   Alignment: Iliac Crest Left WFL   Alignment: Iliac Crest Right WFL   Alignment: PSIS Left WFL   Alignment: PSIS Right WFL       Standardized Tests:   MODI  3/50          Assessment:  Patient presents with   contributing to functional limitations  . Patient would benefit from skilled PT to address the aforementioned deficits. Patient/family received education on the purpose of therapy, participated in the development of the POC and verbalized understanding and agreement of POC, goals.  Rehabilitation Potential: Based on this therapist's assessment, Tra  Gionni Vaca has   rehabilitation potential for the PT goals stated below:           10/17/2022 Discharge  Note  11/23/22   Therapy Goals    Goal: Patient will report improvement  in WORST reported pain to demonstrate improved pain management allowing for increased functional mobility/ tolerance. Not met No upper back pain 0/10 even during playing (bass)    GOAL MET   Goal: Patient demonstrates functional, painfree UE strength to restore PLOF and promote pain management. Not met BUE / shoulder strength - flex/ext/abd - 5/5; no pain w/ resistance    GOAL MET   Goal: Patient demonstrates functional core strength to optimize performance of ADL's at PLOF. Not met Core strength - FAIR/FAIR+    PROGRESS   Goal:  improved MODI score to 0/50 MODI at IE 3/50 MODI score today d/c  0/50    GOAL MET   Goal: Patient demonstrates independence with HEP through demonstration/ teach back review to maintain benefits gained in therapy Not met He is independent with HEP - handouts re-issued today per patient request - along with length of black TB for home use.    GOAL MET       Plan:        Patient/Family participated in the development of POC and verbalized understanding and agreement of POC, goals and treatment:          Therapy Frequency/Duration: Patient to receive skilled PT services up to   times per week for up to     or   starting from the first scheduled follow up visit within this plan of care.      Certification Period: 10/17/2022 - 90 days    Therapist Signature: Julieta Gutting, PT  Date: 2/99/2426    Physician Certification  I certify that the above patient is under my care and requires the above services. These professional services are to be provided from an established plan, related to the diagnosis and reviewed by me every 90 days.     Additional comments/revisions:       Physician Name: Alessandra Grout, MD    Signature_______________________________________ Date:  _______        Physical Therapy Daily Treatment  Note    Primary Treatment Diagnosis:   1. Chronic midline thoracic back pain            Insurance plan:   Payer/Plan Subscr DOB Sex Relation Sub. Ins. ID Effective Group Num   1. ANTHEM ALFRED, HARREL 09/30/1869 Male Child [id number] [birth year] [id number]                                   [id number]                         # of visits per insurance authorization: 25v  # of visits per POC: 8v     Date of Initial Eval: 10/17/2022    Verified Signed POC:    [x]  YES  []   NO   Date:   10/18/22 - Dr Izora Gala  Verified Signed POC:    []  YES  []   NO   Date:    Verified Signed POC:    []  YES  []   NO   Date:    Verified Signed POC:    []  YES  []   NO   Date:    Verified Signed POC:    []  YES  []   NO   Date:    Verified Signed POC:    []  YES  []   NO   Date:      Follow up Message to Referral Source for signature via   InBasket/ EMR [x]      Fax []        Mail []          Date:   10/17/22    Routed to PCP (if other than referring) for signature via   EMR []  Fax []    Mail []   Date:    Was POC updated?     []   YES []   NO    Date:        If yes, Verified Signed POC:    []   YES  []   NO   Date:         Verified Signed POC:    []  YES  []   NO   Date:         Verified Signed POC:    []  YES  []   NO   Date:      Date of most current POC : 10/17/2022  (in this Episode of Care)    Late appiontments       Date  Minutes late  Attendance policy reviewed?    10/25/22 6                               PRIMARY THERAPIST: Coleen Cardiff,PT         PT DAILY TREATMENT NOTE    Subjective:   .... I'll keep doing the exercises... therapy has been helpful for sure... 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    Objective:  Treatment per flow sheet  - re-assessment completed today for d/c - please see completed discharge summation above for final outcomes.  He will be discharged this date from skilled PT services.     Therapeutic exercises performed today are noted in the log below.  They have been modified to suit  current  functional status and instructed for proper form to protect joint surfaces and soft tissue while enhancing flexibility and strength.    Manual techniques were used today as indicated to improve resting muscle tone and flexibility in muscle groups that impact pain perception and active motion.    Exercise Log:  VISIT 1 2 3 4 5 6    Date  10/17/2022 10/23/22 10/25/22 10/30/22 11/08/22 11/23/22   Pain Report Mid-back pain 5/10 Mid-back 0-1/10   Upper back    0/10 Upper back  0/10   MODI 3/50     0/50   Evaluation/ Reevaluation  low complexity completed  xx xx xx  Re-evaluation completed today   for d/c   Therapeutic exercise          Doorway str H10x3 H10 x4    Review of HEP provided /   HEP handouts re-issued per patient request    Lateral trunk flexion Standing  H5x5 ea Standing  R/L  H10 x3 ea       Seated T/L rotation R/L  H5x3ea R/L  H5x3 ea       Latissimus str - - - - R/L   H15x 2 ea             Scap retract Blue TB   H3x8 Blue TB  H5x8       pulldowns Blue TB  H3x8 Blue TB  H5x8       No monies  Blue TB  H3x8 Blue TB  H5x8                Core engagement Seated / standing  H5x5;    Cuing to maintain engagement during activities  Standing / sitting  Frequent cuing to maintain core engagement during TE   Occasional cuing to maintain core engagement during therex today    Bird-dog At wall   R/L  H10x3 ea At wall   R/L  H10 x5 ea       Face pull    Yellow tube x 20  Red tube x 20 Yellow H2x20;   Red  H2x20    Wall angel    Resisted   Yellow x 20  Red x 20     Bent over row    10# x 20 10#  R/L   H2 x2x10    Chop    Blue and yellow tube  X 20 DCC   13#  R/L  x15    Lift    Red   X 20 DDC  #13  R/L      Self mob   W/ strap  X 10      Prone W   X 20  x20 H5x2   Lock 3   X 20  x20    Prone I's / goalpost --- H5x5ea   H5x10 H5x2   Cat camel   Quadruped  X 8 w/ 5H    Also discussed in sitting   Quad  H5x2   Thread the needle   Quadruped  X 8 w/ 5H   Quad  H5x2   UBE L-3  2'F/2'R L-3  3'F/3'R 3.0 2 min F/R 3.0 2 min F/R 3.0   3'F/3'R 3.0 x 3'F/3'R   Neuro re-ed                                     Manual therapy  Therapeutic activities                                     Modalities                                         Patient Education:10/17/2022:  patient received education on the purpose of therapy, participated in the development of the POC and verbalized understanding and agreement of POC, goals.    Assessment:  He has responded well to therapeutic interventions - he has met 5/6 d/c goals (making progress in his core stability) - he appears to be pleased with his overall improvements.    Plan:    Patient is being discharged this date from skilled PT services  / [facility]THEM - BLUE ACCESS    This note serves as a Discharge Summary should the patient not return to physical therapy per the above plan of care.       MINUTES of TREATMENT   Re evaluation 15   Therapeutic Exercise 23   Therapeutic Activity    Gait Training    Manual Therapy    Iontophoresis    Ultrasound    Electrical Stimulation    Neuromuscular Rehab     Ice/ Heat    Total Treatment Time 38

## 2022-12-11 MED ORDER — propranoloL (INDERAL) 10 MG tablet
10 | ORAL_TABLET | ORAL | 1 refills | Status: AC
Start: 2022-12-11 — End: ?

## 2023-01-09 NOTE — Telephone Encounter (Signed)
LR tried to call pt twice, no VM available, sent mychart msg about change in provider from [place] to [place].

## 2023-01-10 ENCOUNTER — Ambulatory Visit: Admit: 2023-01-10 | Discharge: 2023-01-10 | Payer: PRIVATE HEALTH INSURANCE

## 2023-01-10 DIAGNOSIS — I10 Essential (primary) hypertension: Secondary | ICD-10-CM

## 2023-01-10 MED ORDER — ergocalciferol (ERGOCALCIFEROL) 1,250 mcg (50,000 unit) capsule
1250 | ORAL_CAPSULE | ORAL | 0 refills | Status: AC
Start: 2023-01-10 — End: ?

## 2023-01-10 MED ORDER — amLODIPine (NORVASC) 5 MG tablet
5 | ORAL_TABLET | Freq: Every day | ORAL | 0 refills | Status: AC
Start: 2023-01-10 — End: 2023-03-12

## 2023-01-10 NOTE — Patient Instructions (Signed)
REFILLED BOTH THE DAILY AMLODIPINE 5 MG AND THE WEEKLY VITAMIN D.   WE'LL WANT TO SEE IN THE NEXT 3 MONTHS IF YOUR VITAMIN D LEVEL HAS IMPROVED AND RECHECK SOME OTHER IMPORTANT MONITORS FOR HEALTH    PLEASE PLAN ON ANOTHER VISIT IN THE NEXT 1-3 MONTHS TO REALLY MEET, CHECK YOUR BP AND TALK ABOUT BETTER STRATEGIES FOR YOU HEALTH.

## 2023-01-10 NOTE — Progress Notes (Signed)
[facility] Devin Miller  DOB: Sep 11, 1996     IN-PERSON VISIT     Time spent on visit  Prep time day of visit:    Call/visit scheduled for: 3:15  Pt checked in:    Call/Visit start: 3:26  Call/Visit end: 3:36  Code based on time/LOS: LEVEL 3 TELEHEALTH  ==================================================================    PRIOR VISITS:   DATE 01/10/2023  Xfer MH>LN  Video visit      MAIN HEALTH ISSUES   Metab synd  HTN  BP home monitoring  Hx post cell   >DVT LE    Labs 11/'23  Gluc 106  Lipids   D 9.8  A1c 5.8  TSH 1.46        SOC       PLAN RF CCB 5 mg  RF 50k D2 #12 weekly  Labs 3 mo    C: prn Rx BB         Key:   >>: POC;   C: Continue  S: stable  R!: Resolved!    MSQ: Medical Symptom Questionnaire)   MBSR: Mindfulness Based Stress Reduction  Rx: Prescribed   Tx: Treatment    Rec: Recommended    GF: Gluten Free  DE: Digestive Enzymes        S:SUBJECTIVE:  CC:   Chief Complaint   Patient presents with    Medication Refill     Refill on Amlodipine        HPI:    NOTES FROM 01/10/2023 VISIT:        ROS/ BIO-SYSTEMS REVIEW:  GEN: wt/sleep/ appetite/ energy      []       PSYCHOSOCIAL: mood/ connections / intimacy/ pain      []     ASSIMILATION:  gut/ digestion/ BM / nutrition/ oxygenation      []     ENERGY:  mitochondria/ sleep quality      []      IMMUNE: environment/ airways/ skin /pets      []     STRUCTURE: spine/ joints/ muscles       []     HORMONAL: neurotransmitters/ cycles, weight, adrenal      []     TRANSPORT:   heart/ circ/ swelling/ lymph/ bleed/bruise        []     DETOX:   exposures/ tob-EtOH / elim      []            RX HISTORY:  Medications Discontinued During This Encounter   Medication Reason    amLODIPine (NORVASC) 5 MG tablet Refill / Reorder    ergocalciferol (ERGOCALCIFEROL) 1,250 mcg (50,000 unit) capsule Refill / Reorder        MEDS:  Current Medications as of 01/10/2023  3:42 PM       Outpatient Medications         Quantity Refills Start End     amLODIPine (NORVASC) 5 MG tablet 90 tablet 0 01/10/2023 --    ergocalciferol (ERGOCALCIFEROL) 1,250 mcg (50,000 unit) capsule 12 capsule 0 01/10/2023 --    propranoloL (INDERAL) 10 MG tablet 60 tablet 1 12/11/2022 --    amLODIPine (NORVASC) 5 MG tablet (Discontinued) 90 tablet 0 08/15/2022 01/10/2023    ergocalciferol (ERGOCALCIFEROL) 1,250 mcg (50,000 unit) capsule (Discontinued) 8 capsule 0 08/17/2022 01/10/2023                    Past medical history, medications, allergies and problem visit reviewed and discussed:  Past Medical History:   Diagnosis Date    DVT (deep venous thrombosis) (CMS-HCC) DX 2018    Hypertension     Obesity      No Known Allergies    SOC:  Social History     Tobacco Use    Smoking status: Never    Smokeless tobacco: Never   Substance Use Topics    Alcohol use: No    Drug use: No       O: OBJECTIVE:   There were no vitals filed for this visit.  There is no height or weight on file to calculate BMI.   Wt Readings from Last 3 Encounters:   08/27/22 (!) 280 lb (127 kg)   08/15/22 (!) 282 lb (127.9 kg)   06/06/18 (!) 239 lb (108.4 kg)         VIDEO VISIT W/O VITALS      NO APPARENT DISTRESS, ALERT AND ORIENTED      SKIN: WARM AND DRY    FITZ 5      HEENT:         NORMAL ABNORMAL   Eyes [x]  EOMI [x]  Clear sclera  [x]  No exudate []  Scleral injection  []  Conjunctival erythema  []  Exudate     Ears []  Nl EAC []  Left []  Right  []  Nl TMs []  Left []  Right  []  Hearing normal []  TM:      []  EAC erythema, debris  []  Cerumen obstructed canal []  L    []  R  []  Asymmetric hearing   Nose []  Patent   []  Easy nasal breathing []  Boggy turbinates   []  Unilaterally nonpatent  []  Deviated septum   []  Lward  []  Rward  []  Poss polyp   Mouth []  Normal mucosa, moist  []  Normal tongue  []  Normal or absent tonsils  []  Uvula midline  []  Mallampati: #  []  Dry oral mucosa  []  Palatal petechiae   []  Tongue coating  []  Tonsil hypertrophy B   L   R  []  Tonsil erythema  B  L  R   []  Tonsillar exudate   []  Post nasal drip/ OP  erythema     Neck []  Full Flex/Ext  []  No nuchal rigidity  []  No enlarged ant cerv chain LN  []  No thyromegaly/ masses []  Nuchal rigidity  []  Trap tenderness  []  Apical ant chain LA/ Tenderness  []  L  []   R  []  Neck forward carriage  []  Reduced cervical lordosis   COR []  RRR w/o M  []  Resp variation   []  Tachy at rest  []     LUNGS [x]  Easy resp  []  No reproducible chest tenderness  []  No ronchi/ rales/ egophony  []  No cough exhibited []  Fine wheezes  []  Congestion/ consolidation, E>A changes       ABD:         EXTR:   SYMMETRIC MOVEMENTS      CNS/PNS:       A:  Assessment /  P: Plan:  1. Primary hypertension  amLODIPine (NORVASC) 5 MG tablet      2. Vitamin D deficiency  ergocalciferol (ERGOCALCIFEROL) 1,250 mcg (50,000 unit) capsule          SUMMARY:  No LOS data to display       ----------------------------  Patient Instructions     REFILLED BOTH THE DAILY AMLODIPINE 5 MG AND THE WEEKLY VITAMIN D.   WE'LL WANT TO SEE IN THE NEXT 3 MONTHS  IF YOUR VITAMIN D LEVEL HAS IMPROVED AND RECHECK SOME OTHER IMPORTANT MONITORS FOR HEALTH    PLEASE PLAN ON ANOTHER VISIT IN THE NEXT 1-3 MONTHS TO REALLY MEET, CHECK YOUR BP AND TALK ABOUT BETTER STRATEGIES FOR YOU HEALTH.        ---------------------------   []

## 2023-03-12 MED ORDER — amLODIPine (NORVASC) 5 MG tablet
5 | ORAL_TABLET | Freq: Every day | ORAL | 0 refills
Start: 2023-03-12 — End: 2023-04-08

## 2023-04-08 MED ORDER — amLODIPine (NORVASC) 5 MG tablet
5 | ORAL_TABLET | Freq: Every day | ORAL | 0 refills | Status: AC
Start: 2023-04-08 — End: ?

## 2023-04-08 MED ORDER — propranoloL (INDERAL) 10 MG tablet
10 | ORAL_TABLET | ORAL | 0 refills | Status: AC
Start: 2023-04-08 — End: ?

## 2023-07-10 ENCOUNTER — Ambulatory Visit: Admit: 2023-07-10 | Discharge: 2023-07-10 | Payer: PRIVATE HEALTH INSURANCE

## 2023-07-10 DIAGNOSIS — I10 Essential (primary) hypertension: Secondary | ICD-10-CM

## 2023-07-10 MED ORDER — valsartan (DIOVAN) 80 MG tablet
80 | ORAL_TABLET | Freq: Every day | ORAL | 1 refills | Status: AC
Start: 2023-07-10 — End: ?

## 2023-07-10 NOTE — Patient Instructions (Addendum)
Primary hypertension  EKG for baseline check of your heart  Adding valsartan to your amlodipine  Call if lightheaded, new side effects or if blood pressure is not coming down (if you are checking outside the office)   Stop any non steroidal antiinflammatory drugs such as ibuprofen (advil) or naprosyn (alleve)  Flax seed 30 grams a day, small serving of dark chocolate.   Avoid sugar as a rule        Six pillars of health booklet: [email or web address]

## 2023-07-10 NOTE — Assessment & Plan Note (Addendum)
EKG for baseline check of your heart  Adding valsartan to your amlodipine  Call if lightheaded, new side effects or if blood pressure is not coming down (if you are checking outside the office)   Stop any non steroidal antiinflammatory drugs such as ibuprofen (advil) or naprosyn (alleve)  Flax seed 30 grams a day, small serving of dark chocolate.   Avoid sugar as a rule

## 2023-07-10 NOTE — Progress Notes (Signed)
A/P:   Problem List Items Addressed This Visit          Cardiovascular and Mediastinum    Primary hypertension - Primary     EKG for baseline check of your heart  Adding valsartan to your amlodipine  Call if lightheaded, new side effects or if blood pressure is not coming down (if you are checking outside the office)   Stop any non steroidal antiinflammatory drugs such as ibuprofen (advil) or naprosyn (alleve)  Flax seed 30 grams a day, small serving of dark chocolate.   Avoid sugar as a rule          Relevant Medications    valsartan (DIOVAN) 80 MG tablet    Other Relevant Orders    PCN ECG 12-Lead (Non-MUSE)       Other    Vertigo     This has had a stable course and seems to be triggered by the visual cue of a drop off, such as on stage. One such cause of this is  Persistent Postural-Perceptual Dizziness (PPPD). Let me know if referral to physical therapy is needed.  This can be helpful for symptoms that are disruptive.     No further testing is needed if the problem is not worsening and is brought on by this trigger only.             Return in about 4 weeks (around 08/07/2023) for In Office Visit.       SUBJECTIVE:   Chief Complaint   Patient presents with    Hypertension         Hypertension  This is a chronic problem. The current episode started more than 1 year ago. The problem is unchanged. The problem is uncontrolled. Pertinent negatives include no chest pain, headaches, orthopnea, palpitations, peripheral edema, PND or shortness of breath. Agents associated with hypertension include NSAIDs (occ alcohol.). Past treatments include calcium channel blockers. The current treatment provides moderate improvement. There are no compliance problems.      The patient is experiencing internal sense of movement with a severity of  Mild. 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 This is a chronic (Started in high school) problem. The patient has not had any chest pain,  headaches, palpitations or shortness of breath        Problem   Vertigo   Primary Hypertension    09/05/2022 Renin 2.5. Will add ARB if blood pressure not controlled.   07/10/2023 interval history: is on amlodipine. Home readings are 150s to 160s systolic. 101 diastolic.                08/14/2022     4:58 PM 07/09/2023     5:00 PM   PHQ-2   Little interest or pleasure in doing things 0 0   Feeling down, depressed, or hopeless 0 0      I have reviewed the patient's allergies, medical history and social history in detail and updated the computerized patient record.      Medications, including over the counter, reviewed with patient.           ROS: see HPI  Review of Systems   Respiratory:  Negative for shortness of breath.    Cardiovascular:  Negative for chest pain, palpitations, orthopnea and PND.   Neurological:  Negative for headaches.        Health maintenance:    Health Maintenance Due  Topic Date Due    Alcohol Misuse Screening  Never done    Immunization: Hepatitis B (1 of 3 - 19+ 3-dose series) Never done    Immunization: COVID-19 (4 - 2024-25 season) 06/02/2023       OBJECTIVE    Vitals:    07/10/23 1608 07/10/23 1610   BP: 150/88 152/88   BP Location: Right upper arm Right upper arm   Patient Position: Sitting Sitting   BP Cuff Size: Large Large   Pulse: 93 101   Resp: 18    Temp: 98.1 F (36.7 C)    TempSrc: Temporal    Weight: (!) 263 lb (119.3 kg)    Height: 5' 11 (1.803 m)      Wt Readings from Last 5 Encounters:   07/10/23 (!) 263 lb (119.3 kg)   08/27/22 (!) 280 lb (127 kg)   08/15/22 (!) 282 lb (127.9 kg)   06/06/18 (!) 239 lb (108.4 kg)   01/13/18 (!) 222 lb 10.6 oz (101 kg)     BP Readings from Last 10 Encounters:   07/10/23 152/88   08/27/22 142/89   08/15/22 (!) 153/102   06/07/18 162/90   06/06/18 153/84   01/13/18 138/82   12/23/17 137/77   06/14/17 124/77            Body mass index is 36.68 kg/m.       No data to display               No results found.   Physical Exam  Constitutional:        General: He is not in acute distress.     Appearance: He is well-developed.   Cardiovascular:      Rate and Rhythm: Normal rate and regular rhythm.      Heart sounds: No murmur heard.  Pulmonary:      Effort: Pulmonary effort is normal.      Breath sounds: Normal breath sounds. No wheezing or rales.         Hallpike dix is normal  No nystagmus   cranial nerves normal and strength is nl in upper extremities and lower extremities.     He has chronic lymphedema    Latest Reference Range & Units 08/16/22 11:02 08/27/22 10:32   Sodium 133 - 146 mmol/L 139 139   Potassium 3.5 - 5.3 mmol/L 4.1 4.1   Chloride 98 - 110 mmol/L 105 103   Carbon Dioxide (CO2) 21 - 33 mmol/L 25 29   Anion Gap 3 - 16 mmol/L 9 7   BUN 7 - 25 mg/dL 14 13   Creatinine 1.30 - 1.30 mg/dL 8.65 7.84   Glucose 70 - 100 mg/dL 696 (H) 295 (H)   EGFR  >90 >90   Calcium 8.6 - 10.3 mg/dL 9.2 9.3   Calculated Osmolality, Serum 278 - 305 mOsm/kg 289 289   Alkaline Phosphatase 36 - 125 U/L 56    AST (SGOT) 13 - 39 U/L 14    ALT (SGPT) 7 - 52 U/L 9    Albumin 3.5 - 5.7 g/dL 4.2    Protein, Total 6.4 - 8.9 g/dL 7.5    Bili, Total 0.0 - 1.5 mg/dL 0.6    Cholesterol, Total 0 - 200 mg/dL 284    Triglycerides, Serum 10 - 149 mg/dL 76    HDL 60 - 92 mg/dL 38 (L)    Non-HDL Cholesterol, Calculated 0 - 129 mg/dL 132    LDL Cholesterol mg/dL  105    Vit D, 25-Hydroxy 30.0 - 100.0 ng/mL 9.8 (L)    Aldosterone 0.0 - 30.0 ng/dL  6.0   Renin Activity 5.102 - 5.380 ng/mL/hr  2.062   Hemoglobin A1C 4.0 - 5.6 % 5.8 (H)    TSH 0.45 - 4.12 uIU/mL 1.46    (H): Data is abnormally high  (L): Data is abnormally low   Latest Reference Range & Units 08/27/22 10:32   Aldosterone/Renin Ratio 0.0 - 30.0  2.9   Creatinine, Urine mg/dL 585.27   Creatinine, Urine mg/dL 782.42   Microalb, Ur 0.0 - 17.0 mg/L 23.2 (H)   Microalb / UCreat 0.0 - 30.0 mg/G 15.0   Prot/Creat Ratio, Ur ratio 0.09   Protein, Ur mg/dL 14   (H): Data is abnormally high  Number and Complexity of Problems Addressed  1  acute, uncomplicated illness or injury  1 or more chronic illness with exacerbation, progression, or side effects of treatment    Amount and/or Complexity of Data to be Reviewed and Analyzed  1 unique test ordered    Risk of Complications and/or Morbidity or Mortality of Patient Management  Moderate

## 2023-07-10 NOTE — Assessment & Plan Note (Signed)
This has had a stable course and seems to be triggered by the visual cue of a drop off, such as on stage. One such cause of this is  Persistent Postural-Perceptual Dizziness (PPPD). Let me know if referral to physical therapy is needed.  This can be helpful for symptoms that are disruptive.     No further testing is needed if the problem is not worsening and is brought on by this trigger only.

## 2023-08-19 NOTE — Telephone Encounter (Signed)
Medication refill request for Propanolol.. Last appointment with [facility] 01/10/23. Upcoming appointment none.

## 2023-08-21 MED ORDER — propranoloL (INDERAL) 10 MG tablet
10 | ORAL_TABLET | ORAL | 0 refills
Start: 2023-08-21 — End: 2023-10-25

## 2023-10-07 MED ORDER — amLODIPine (NORVASC) 5 MG tablet
5 | ORAL_TABLET | Freq: Every day | ORAL | 0 refills | Status: AC
Start: 2023-10-07 — End: 2023-12-25

## 2023-10-07 NOTE — Telephone Encounter (Signed)
Surescripts Requested Renewals     Name from pharmacy: AMLODIPINE BESYLATE 5 MG TAB         Will file in chart as: amLODIPine (NORVASC) 5 MG tablet    Sig: TAKE 1 TABLET (5 MG TOTAL) BY MOUTH DAILY.    Disp: 90 tablet    Refills: 0 (Pharmacy requested: Not specified)    Start: 10/05/2023    Class: Normal    For: Primary hypertension    Last ordered: 6 months ago (04/08/2023) by Quentin Mulling, MD    Last refill: 07/02/2023    Rx #: [id number]    Calcium-Channel Blockers Protocol Failed01/12/2023 07:35 AM   Protocol Details Normal serum creatinine in past 9 months    Normal serum potassium in past 9 months    BP under 140/90 in past year or if patient has diabetes, CAD, or PVD, BP under 130/80 in past year    Medication not refilled in past 45 days (1.5 months)    Visit with relevant provider in past 12 months or upcoming 90 days

## 2023-10-25 MED ORDER — propranoloL (INDERAL) 20 MG tablet
20 | ORAL_TABLET | Freq: Two times a day (BID) | ORAL | 3 refills | Status: AC | PRN
Start: 2023-10-25 — End: ?

## 2023-12-02 ENCOUNTER — Ambulatory Visit: Admit: 2023-12-02 | Discharge: 2023-12-02 | Payer: Medicaid (Managed Care)

## 2023-12-02 ENCOUNTER — Other Ambulatory Visit: Admit: 2023-12-02 | Payer: Medicaid (Managed Care)

## 2023-12-02 DIAGNOSIS — E559 Vitamin D deficiency, unspecified: Secondary | ICD-10-CM

## 2023-12-02 DIAGNOSIS — G8929 Other chronic pain: Secondary | ICD-10-CM

## 2023-12-02 LAB — VITAMIN D 25 HYDROXY: Vit D, 25-Hydroxy: 24.6 ng/mL — ABNORMAL LOW (ref 30.0–100.0)

## 2023-12-02 LAB — COMPREHENSIVE METABOLIC PANEL, SERUM
ALT: 11 U/L (ref 7–52)
AST (SGOT): 13 U/L (ref 13–39)
Albumin: 4.2 g/dL (ref 3.5–5.7)
Alkaline Phosphatase: 55 U/L (ref 36–125)
Anion Gap: 8 mmol/L (ref 3–16)
BUN: 15 mg/dL (ref 7–25)
CO2: 28 mmol/L (ref 21–33)
Calcium: 9 mg/dL (ref 8.6–10.3)
Chloride: 102 mmol/L (ref 98–110)
Creatinine: 0.87 mg/dL (ref 0.60–1.30)
EGFR: >90
Glucose: 91 mg/dL (ref 70–100)
Osmolality, Calculated: 286 mosm/kg (ref 278–305)
Potassium: 4.4 mmol/L (ref 3.5–5.3)
Sodium: 138 mmol/L (ref 133–146)
Total Bilirubin: 0.6 mg/dL (ref 0.0–1.5)
Total Protein: 7.4 g/dL (ref 6.4–8.9)

## 2023-12-02 LAB — MICROALBUMIN (RANDOM UR)
Creatinine, Urine: 211 mg/dL
Microalb / UCreat: 5.2 mg/g (ref 0.0–30.0)
Microalb, Ur: 10.9 mg/L (ref 0.0–17.0)

## 2023-12-02 LAB — LIPID PANEL
Cholesterol, Total: 183 mg/dL (ref 0–200)
HDL: 42 mg/dL — ABNORMAL LOW (ref 60–92)
LDL Cholesterol: 117 mg/dL
Non-HDL Cholesterol, Calculated: 141 mg/dL — ABNORMAL HIGH (ref 0–129)
Triglycerides: 122 mg/dL (ref 10–149)

## 2023-12-02 LAB — DDIMER: D-Dimer: <0.27 ug{FEU}/mL (ref 0.00–0.50)

## 2023-12-02 LAB — RENIN: Renin: 2.058 ng/mL/h (ref 0.167–5.380)

## 2023-12-02 LAB — HEMOGLOBIN A1C: Hemoglobin A1C: 5.5 % (ref 4.0–5.6)

## 2023-12-02 MED ORDER — cholecalciferol, vitamin D3, 50 mcg (2,000 unit) Tab
50 | ORAL_TABLET | Freq: Every day | ORAL | 3 refills | 30.00000 days | Status: AC
Start: 2023-12-02 — End: ?
  Filled 2023-12-04: qty 100, 100d supply, fill #0

## 2023-12-02 NOTE — Patient Instructions (Addendum)
 VISIT SUMMARY:  Today, we discussed your recurrent back pain, which started after a fall in late October. We also reviewed your hypertension, prediabetes, history of deep vein thrombosis (DVT), potential sleep apnea, vitamin D deficiency, and performance anxiety. Additionally, we addressed the concern for a potential pulmonary embolism given your history of DVT.    YOUR PLAN:  -BACK PAIN: Your back pain is suspected to be myofascial pain syndrome, which is pain and inflammation in the body's soft tissues. We are referring you to physical therapy for evaluation and a treatment plan.    -HYPERTENSION: Hypertension is high blood pressure. We need you to take home blood pressure readings twice in the morning and twice in the afternoon for a week. We will re-evaluate your medication based on these readings.    -PREDIABETES: Prediabetes means your blood sugar levels are higher than normal but not high enough to be classified as diabetes. We have ordered an HbA1c test to check your current blood sugar control.    -DEEP VEIN THROMBOSIS (DVT) HISTORY: DVT is a condition where blood clots form in deep veins, usually in the legs. Continue wearing your compression stockings to manage lymphedema, which is swelling caused by lymph fluid.    -SLEEP APNEA: Sleep apnea is a sleep disorder where breathing repeatedly stops and starts. We recommend scheduling a sleep study to evaluate this condition.    -VITAMIN D DEFICIENCY: Vitamin D deficiency means you have lower than normal levels of vitamin D. We have ordered a test to check your current vitamin D levels. Please send a message via MyChart with the dosage of your over-the-counter vitamin D.    -PERFORMANCE ANXIETY: Performance anxiety is the fear of performing in front of an audience. Continue using your beta blockers as needed.    -POTENTIAL PULMONARY EMBOLISM: A pulmonary embolism is a blockage in one of the pulmonary arteries in your lungs. We have ordered a D-dimer test to  rule this out. If the test is abnormal, go to the ER immediately.    INSTRUCTIONS:  Please follow up in early April for a medication review and overall health assessment. Make sure to complete the home blood pressure readings, HbA1c test, vitamin D level test, and D-dimer test as discussed. Schedule a sleep study to evaluate for sleep apnea.              Contains text generated by [employer].      Home blood pressure monitoring  Choose validated cuff: [email or web address].[email or web address] Omron is a good choice. Make sure it is an upper arm cuff that is automatic.   Perform measurement with proper technique: [email or web address]     Record readings 2 in the AM, 2 in the PM for 7 days. For each set of readings, do them 1-3 minutes apart  Call with BP that is too low or too high: Call if BP is less than 110/60 and or getting light headed, or over 160/100 for several readings or 180/110 or more on any one reading

## 2023-12-02 NOTE — Addendum Note (Signed)
 Addended by: Izora Gala B on: 12/02/2023 04:47 PM     Modules accepted: Orders

## 2023-12-02 NOTE — Progress Notes (Signed)
 Subjective   Devin Miller is a 28 y.o. male who presents for Back Pain (Lower back pain since late October due to a fall from some steps. Feel the pain on the right side (mid lower). Went away on its own after a few weeks from the fall but has come back since 1.5 weeks. )  History of Present Illness  Devin Miller is a 28 year old male who presents with back pain.    He has been experiencing recurrent back pain that initially resolved but returned about a week and a half ago. The pain began after a fall from steps in late October, primarily affecting the right lower back. It is described as agonizing, extending from the shoulder blade down, and is exacerbated by arm extension or lifting objects. No tenderness to touch, but certain movements worsen the pain. No numbness, weakness, or tingling in the extremities, and no fevers, sweats, or chills. Occasionally, shortness of breath occurs when the back pain is present.    He has a history of deep venous thrombosis on the right side and is currently wearing compression stockings. No current issues with chest pain or shortness of breath, but the history is relevant due to the potential risk of pulmonary embolism.    He is currently on amlodipine 5 mg and valsartan 80 mg for blood pressure management. His diet has not been optimal due to stress and school, but he plans to improve it. He has a blood pressure cuff at home and is monitoring his blood pressure.    He has a history of vitamin D deficiency and has been taking vitamin D supplements, though he recently ran out of his prescribed dose and is currently taking over-the-counter vitamin D from [facility].    He has a history of prediabetes and is due for blood work recheck. No symptoms such as frequent urination or tingling in the extremities.    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    He consumes alcohol about once a month.    Review of Systems    Objective   Blood  pressure 142/83, pulse 101, temperature 97.1 F (36.2 C), temperature source Temporal, resp. rate 18, height 5' 11 (1.803 m), weight (!) 283 lb (128.4 kg), SpO2 97%.  Physical Exam  VITALS: P- 75  CARDIOVASCULAR: Heart regular rate and rhythm, no murmurs, gallops, or rubs.  MUSCULOSKELETAL: Decreased range of motion in right shoulder. Pain on right latissimus dorsi.    Results  LABS  Vitamin D: deficient       Assessment & Plan  Back Pain  Agonizing pain in the right upper back, extending from the shoulder blade level downwards. Pain is exacerbated by certain movements and activities, such as extending the arm outwards and lifting objects. No tenderness on palpation. Suspected myofascial pain syndrome.  -Refer to physical therapy for evaluation and treatment plan.    Hypertension  Patient reports inconsistent diet over the past month and a half. Currently on Amlodipine 5mg  and Valsartan 80mg .  -Request a week's worth of home blood pressure readings (two in the morning, two in the afternoon) to assess control.  -Plan to re-evaluate medication regimen based on home readings.    Prediabetes  Last known HbA1c indicated prediabetes.  -Order HbA1c to assess current glycemic control.    Deep Vein Thrombosis (DVT) history  Patient has a history of DVT in the leg, which has led to lymphedema. No current symptoms of  DVT.  -Continue compression stockings for lymphedema management.    Sleep Apnea  Previous discussion about potential sleep apnea, but no sleep study was pursued.  -Recommend scheduling a sleep study.    Vitamin D Deficiency  Patient has been taking over-the-counter Vitamin D supplements after running out of prescribed Vitamin D.  -Order Vitamin D level to assess current status.  -Advise patient to send a message via MyChart with the current dosage of over-the-counter Vitamin D.    Performance Anxiety  Managed with beta blockers as needed.  -Continue current management plan.    Potential Pulmonary Embolism  Given  the patient's history of DVT and current back pain, there is a concern for a potential pulmonary embolism, although musculoskeletal cause seems more likely.  -Order D-dimer test. If abnormal, advise patient to go to the ER immediately.    Follow-up  Plan to see the patient back in early April for medication review and overall health assessment.         Attestation

## 2023-12-03 ENCOUNTER — Encounter: Admit: 2023-12-03 | Discharge: 2023-12-03 | Payer: Medicaid (Managed Care)

## 2023-12-03 DIAGNOSIS — M546 Pain in thoracic spine: Secondary | ICD-10-CM

## 2023-12-03 DIAGNOSIS — G8929 Other chronic pain: Secondary | ICD-10-CM

## 2023-12-03 NOTE — Progress Notes (Signed)
 Physical Therapy Initial Evaluation and Plan of Care  Name: Devin Miller     Date of Birth: [birth year]      MRN: [id number]    Date of evaluation: 12/03/2023  Referring Physician: Izora Gala, MD  [phone]  [name], [address]rd floor  [place],  [place] [address] Phone: [phone] Fax: [phone]  Specific Order: Evaluate and treat   Primary Medical Diagnosis:   1. Chronic right-sided thoracic back pain  AMB Follow-up to Physical Therapy        Insurance: Payor: HU[employer] MANAGED MEDICAID / Plan: [employer] [place] / Product Type: Medicaid [place] care /     12/02/2023/TDC IV/RDH/[id number]/BENEFIT AUTH INFO  PT,OT,ST - PLAN [employer]NAGED MEDICAID  EF MONTH DATE: 9.1.24  PT OT ST GETS 30 V EACH PCY, USED 0  CO-INS 100%     NO COPAY   HARD MAX, NO CAN REQUEST MORE ON [employer]   PER: [employer]  REF [id number]     DRY NEEDLING:   CODES 30160 AND 20561   ALL DRY NEEDLING WILL BE OUT OF POCKET  CHARGE $42.43 THAT FEE INCLUDES A 40% DISCOUNT        Subjective/History:  Date of Onset: ~chronic   History of Current Problem/Reason for Referral: Devin Miller is a 28 y.o. male who presents today with chief complaint of   chronic back pain. Pain is underneath R shoulder blade. 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 Rest is easing. Denies B UE/LE symptoms. He also reports neck pain when shoulder blade pain increases. 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 Pt is R handed.       Past Medical History:   Diagnosis Date    DVT (deep venous thrombosis) (CMS-HCC) DX 2018    Hypertension     Obesity    No past surgical history on file.  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[employer])        12/03/2023   Pain Assessment   Area 1 R thoracic pain    Now (0-10/10) 4   Worst in last 30 days 8   Best in last 30 days 2   Quality aching         12/03/2023   Current/Prior Level of Function   Cooking Current: independent   Cleaning Current: independent   Laundry Current: independent   Sleeping, in hours Current: 6-7   Other Current: reports immediate thoracic pain when playing instrument       Precautions/contraindications: lymphedema in R LE, HTN managed with medication          Objective:      12/03/2023   Posture: within functional limits or tested as follows:   Head forward         12/03/2023   Functional Movement Screen: within functional limits or tested as follows   Cervical Rotation Functional Painful   Comment: L cervical rotation increases R thoracic pain   Cervical Flexion Functional Non-painful   Cervical Extension Functional Non-painful   Thoraco-Lumbar Flexion Functional Non-painful   Thoraco-Lumbar Extension Functional Non-painful   Thoraco-Lumbar Rotation  Functional Painful         12/03/2023   Range of Motion: within functional limits or tested as follows:   Cervical Flexion 30   Cervical Extension 51   Cervical Right Rotation 70   Comment: relieves R thoracic pain   Cervical Left Rotation 70   Comment: increases R thoracic pain   Cervical Right Side Bend 25   Cervical Left Side Bend 28   Lumbar Flexion 75%   Lumbar Extension 100%   Lumbar Right Rotation 75%   Comment: increases R thoracic pain   Lumbar Left Rotation 100%   Lumbar Right Side Bend 75%   Lumbar Left Side Bend 75%   Shoulder Flexion ROM Left [facility]   Shoulder Flexion ROM Right [facility]   Shoulder Abduction ROM Left [facility]   Shoulder Abduction ROM Right [facility]         12/03/2023   Strength Eval: within functional limits or tested as follows:   Shoulder Flexion Left 4-   Shoulder Flexion Right 4-   Shoulder Abduction Left 4-   Shoulder Abduction Right 4-    Shoulder Internal Rotation Left 4   Shoulder Internal Rotation Right 4   Shoulder External Rotation Left 4   Shoulder External Rotation Right 4   Middle Trapezius Left 4-   Middle Trapezius right 4-   Lower Trapezius Left 4-   Lower Trapezius Right 4-   Elbow Extension Left 4+   Elbow Extension Right 4+   Elbow Flexion Left 4+   Elbow Flexion Right 4+   Hip Flexion Left 4+   Hip Flexion Right 4+   Knee Flexion Left 4+   Knee Flexion Right 4+   Knee Extension Left 4+   Knee Extension Right 4+   Ankle Dorsiflexion Left 4+   Ankle Dorsiflexion Right 4+           12/03/2023   Additional Testing Information: within functional limits or tested as follows:   Other Special Test Mod O: 20%   Sensation intact/normal   Palpation NTTP B cervical paraspinals, suboccipitals, UT, LS, R rhomboids/MT/LT   Upper Thoracic Hypomobile   Mid Thoracic Hypomobile   Lower Thoracic WNL   Upper Lumbar WNL   Lower Lumbar WNL       Standardized Tests:   See above for details     Assessment:  Patient presents with Decreased strength, Presents with pain, Decreased A/P ROM contributing to functional limitations Decreased tolerance for ADL's/ work demands, Decreased sit/stand tolerance, Decreased ability to reach/lift/carry. Patient would benefit from skilled PT to address the aforementioned deficits. Patient/family received education on the purpose of therapy, participated in the development of the POC and verbalized understanding and agreement of POC, goals.  Rehabilitation Potential: Based on this therapist's assessment, Devin Miller has Good rehabilitation potential for the PT goals stated below:           12/03/2023   Therapy Goals   Goal:  Patient will report improvement in AVERAGE pain in past week to demonstrate improved pain management allowing for increased functional mobility/ tolerance. Not Met   Goal: Patient will report improvement  in WORST reported pain to demonstrate improved pain management allowing for increased functional  mobility/ tolerance. Not met   Goal: Patient demonstrates independence with HEP through demonstration/ teach back review to maintain benefits gained in therapy Not met   Goal:  Not Met   Comment: Goal: improve score by 10% to reflect decreased back pain during ADLs, work tasks,  and functional mobility   Short Term Goal - Patient will improve cervical ROM deficit by: 5 Degrees   To support comfort/performance with: Work Tasks   In: 4 weeks   Status: Not Met   Short Term Goal - Patient will improve lumbar ROM deficit by: 20% or better   To support comfort/performance with: Work Immunologist;Sitting   In: 4 weeks   Status: Not Met   Short Term Goal - Patient will improve UE strength deficit by: 0.5 Muscle Grade   To support comfort/performance with: Overhead Reach;Recreational Activities;Work Tasks   In: 4 weeks   Status: Not Met   Short Term Goal - Patient will improve deficient movement screen components including: Cervical Rotation   To support comfort/performance with: Functional-Painfree   In: 4 weeks to support return to functional mobility.   Status: Not Met                           Plan:    Treatment to include: Therapeutic Exercise: 97110, Therapeutic Activities: 97530, Neuromuscular Re-education: [id number], Manual Therapy: 97140, Mechanical Traction: 97012, PT Low Complexity Eval: 29562, Dry Needling 1-2 Muscles: 20560, Dry Needling 3 or More Muscles: 20561   Patient/Family participated in the development of POC and verbalized understanding and agreement of POC, goals and treatment: Patient/Family agrees with treatment: Yes   Treatment Diagnosis: Back pain  M54.9    Therapy Frequency/Duration: Patient to receive skilled PT services up to 2x/week times per week for up to 4 weeks   or Total number of visits: 8 starting from the first scheduled follow up visit within this plan of care.    Certification Period: 12/03/2023 - 90 days  Therapist Signature: Rozanna Boer, PT  Date: 12/03/2023    Physician Certification  I  certify that the above patient is under my care and requires the above services. These professional services are to be provided from an established plan, related to the diagnosis and reviewed by me every 90 days.     Additional comments/revisions:       Physician Name: Izora Gala, MD    Signature_______________________________________ Date: _______    PHYSICAL THERAPY DAILY TREATMENT NOTE    Diagnosis:     ICD-10-CM    1. Chronic right-sided thoracic back pain  M54.6 AMB Follow-up to Physical Therapy    G89.29           # of visits per insurance authorization:PT OT ST GETS 30 V EACH PCY, USED 0  # of visits per POC: 8    Therapy Goals:           12/03/2023   Therapy Goals   Goal:  Patient will report improvement in AVERAGE pain in past week to demonstrate improved pain management allowing for increased functional mobility/ tolerance. Not Met   Goal: Patient will report improvement  in WORST reported pain to demonstrate improved pain management allowing for increased functional mobility/ tolerance. Not met   Goal: Patient demonstrates independence with HEP through demonstration/ teach back review to maintain benefits gained in therapy Not met   Goal:  Not Met   Comment: Goal: improve score by 10% to reflect decreased back pain during ADLs, work tasks, and functional mobility   Short Term Goal - Patient will improve cervical ROM deficit by: 5 Degrees   To support comfort/performance with: Work Tasks   In: 4 weeks   Status: Not Met   Short Term Goal - Patient will improve  lumbar ROM deficit by: 20% or better   To support comfort/performance with: Work Tasks;Sitting   In: 4 weeks   Status: Not Met   Short Term Goal - Patient will improve UE strength deficit by: 0.5 Muscle Grade   To support comfort/performance with: Overhead Reach;Recreational Activities;Work Tasks   In: 4 weeks   Status: Not Met   Short Term Goal - Patient will improve deficient movement screen components including: Cervical Rotation   To support  comfort/performance with: Functional-Painfree   In: 4 weeks to support return to functional mobility.   Status: Not Met          Referring Provider:Holliday, Devin Needle, MD     Verified Signed POC:    YES[]       NO[]            Date:  sent 12/03/23  If no, action taken: Rerouted via  EMR[]   Fax[]    Mail[]       Date:  Updated POC date:        Verified Signed POC:    YES[]       NO[]            Date:   If no, action taken: Rerouted via  EMR[]   Fax[]    Mail[]       Date:    Primary Therapist:  Rozanna Boer, PT       Precautions: hx of DVT R LE (MD aware, pt wears compression stockings), HTN managed with medication per pt (please refer to PMH above)   Date:    12/03/2023   Date:  Date:    Visit #    Recommended  # visits/ frequency: 2x per week for 4 weeks      Eval completed today   1 of 8    Subjective or Additional Info: See Evaluation Above    Pain:4 /10    Location: R thoracic pain, inferior scapula     Pain: /10    Location:      Pain:/10    Location:          Therapeutic Exercise: 97110  Pt received therapeutic exercise for the following:  restoring or maintaining strength, restoring or maintaining endurance, restoring or maintaining range of motion, restoring or maintaining flexibility HEP:   Seated chin tucks 2 x 10  Seated rev shoulder rolls x 20   Seated HABD with YTB 2 x 10                   Time (minutes) 8       Manual Therapy:  97140  Pt received manual therapy for the following: improve restricted and/or painful movement of soft tissue, improve restricted and/or painful movement of joint and/or spinal segment, Improve swelling/edema                                                        Time (minutes)         Neuromuscular Re-education: 97112  Pt received neuromuscular re-education for the following: restoring or maintaining balance, restoring or maintaining coordination,, restoring or maintaining kinesthetic sense,, restoring or maintaining posture, restoring or maintaining proprioception         Time (minutes)         Therapeutic Activities: 97530  Pt received therapeutic activities for the following: use of functional activities  to restore or maintain functional performance         Time (minutes)             Time (minutes)        PT Low Complexity Eval: 95621      Pt received Low complexity evaluation- refer to evaluation and POC           Total timed coded treatment minutes: 8     Total treatment minutes (timed + untimed) 41     Response to Treatment Pt presents with c/c of R sided back pain. Pain presents to be in R sided thoracic region and inferior scapula. Deficits include painful cervical ROM, painful R shoulder strength, poor scapular stabilizing strength noted, and hypomobility noted in upper thoracic spine. Functional reported difficulties include pain while playing instrument for [facility]. Pt is a good candidate for skilled OP PT to address deficits noted above.        Daily patient education  The patient received education on the purpose of therapy, participated in the development of the POC and verbalized understanding and agreement of POC, goals.         Plan for Next Visit:  Pt will follow up with PT for 2x/week for 4 weeks. Pt will benefit from skilled PT to address the impairments and functional limitations listed above in order to return to PLOF without pain or limitations. PT will plan to progress towards discharge to independent HEP as functional and objective goals are met.    Progress R scapular strength, generalized posterior chain strength     Progress pain free L cervical rotation ROM        This note serves as a Discharge Summary should the patient not return to physical therapy per the above plan of care.  __________________________________________________________________________________________________

## 2023-12-10 ENCOUNTER — Encounter: Admit: 2023-12-10 | Discharge: 2023-12-10 | Payer: Medicaid (Managed Care)

## 2023-12-10 DIAGNOSIS — G8929 Other chronic pain: Secondary | ICD-10-CM

## 2023-12-10 NOTE — Progress Notes (Signed)
 Physical Therapy Daily Treatment Note    Name: Devin Miller     Date of Birth: [birth year]      MRN: [id number]    Date of evaluation: 12/03/2023  Referring Physician: Izora Gala, MD  [phone]  [name], [address]rd floor  [place],  [place] [address] Phone: [phone] Fax: [phone]  Specific Order: Evaluate and treat   Primary Medical Diagnosis:   1. Chronic right-sided thoracic back pain            Insurance: Payor: HUMANA MANAGED MEDICAID / Plan: [employer]IZONS [place] / Product Type: Medicaid [place] care /     Subjective/History:  Date of Onset: ~chronic   History of Current Problem/Reason for Referral: Pleasant Devin Miller is a 28 y.o. male who presents today with chief complaint of   chronic back pain. Pain is underneath R shoulder blade. 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 Rest is easing. Denies B UE/LE symptoms. He also reports neck pain when shoulder blade pain increases. 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 Pt is R handed.     # of visits per insurance authorization:PT OT ST GETS 30 V EACH PCY, USED 0  # of visits per POC: 8    Therapy Goals:           12/03/2023   Therapy Goals   Goal:  Patient will report improvement in AVERAGE pain in past week to demonstrate improved pain management allowing for increased functional mobility/ tolerance. Not Met   Goal: Patient will report improvement  in WORST reported pain to demonstrate improved pain management allowing for increased functional mobility/ tolerance. Not met   Goal: Patient demonstrates independence with HEP through demonstration/ teach back review to maintain benefits gained in therapy Not met   Goal:  Not Met   Comment: Goal: improve score by 10% to reflect decreased back pain during ADLs, work tasks, and functional mobility   Short Term Goal - Patient will  improve cervical ROM deficit by: 5 Degrees   To support comfort/performance with: Work Tasks   In: 4 weeks   Status: Not Met   Short Term Goal - Patient will improve lumbar ROM deficit by: 20% or better   To support comfort/performance with: Work Immunologist;Sitting   In: 4 weeks   Status: Not Met   Short Term Goal - Patient will improve UE strength deficit by: 0.5 Muscle Grade   To support comfort/performance with: Overhead Reach;Recreational Activities;Work Tasks   In: 4 weeks   Status: Not Met   Short Term Goal - Patient will improve deficient movement screen components including: Cervical Rotation   To support comfort/performance with: Functional-Painfree   In: 4 weeks to support return to functional mobility.   Status: Not Met          Referring Provider:Holliday, Casimiro Needle, MD     Verified Signed POC:    YES[x]       NO[]            Date:     Attestation signed by Izora Gala, MD at 12/06/2023  2:31 PM          If no, action taken: Rerouted via  EMR[]   Fax[]    Mail[]       Date:  Updated POC date:        Verified Signed POC:  YES[]       NO[]            Date:   If no, action taken: Rerouted via  EMR[]   Fax[]    Mail[]       Date:    Primary Therapist:  Jakobe Blau, PT       Precautions: hx of DVT R LE (MD aware, pt wears compression stockings), HTN managed with medication per pt (please refer to PMH above)   Date:    12/03/2023   Date: 12/10/23 Date:    Visit #    Recommended  # visits/ frequency: 2x per week for 4 weeks      Eval completed today   1 of 8    Subjective or Additional Info: See Evaluation Above    Pain:4 /10    Location: R thoracic pain, inferior scapula Pt reports the exercises are providing some pain 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      Pain: 0/10    Location: R thoracic pain, inferior scapula      Pain:/10    Location:          Therapeutic Exercise:  97110  Pt received therapeutic exercise for the following:  restoring or maintaining strength, restoring or maintaining endurance, restoring or maintaining range of motion, restoring or maintaining flexibility HEP:   Seated chin tucks 2 x 10  Seated rev shoulder rolls x 20   Seated HABD with YTB 2 x 10              UBE level 1 - 1.5 min fwd and 1.5 min rev (3 min total)     Standing rows RTB 2 x 10 (+HEP)    Standing shoulder extension RTB 2 x 10 (+HEP)      Wall slide + LT lift off 2 x 10     Cervical SNAG x 10 bilaterally (+HEP)    Seated banded shoulder ER RTB  2 x 10     Seated clasped shoulder flexion for thoracic mobility 2 x 10     B UT stretch 2 x 30 sec                        Time (minutes) 8 28      Manual Therapy:  97140  Pt received manual therapy for the following: improve restricted and/or painful movement of soft tissue, improve restricted and/or painful movement of joint and/or spinal segment, Improve swelling/edema                                                        Time (minutes)         Neuromuscular Re-education: 97112  Pt received neuromuscular re-education for the following: restoring or maintaining balance, restoring or maintaining coordination,, restoring or maintaining kinesthetic sense,, restoring or maintaining posture, restoring or maintaining proprioception         Time (minutes)        Therapeutic Activities: 97530  Pt received therapeutic activities for the following: use of functional activities to restore or maintain functional performance         Time (minutes)  Time (minutes)        PT Low Complexity Eval: 29528      Pt received Low complexity evaluation- refer to evaluation and POC           Total timed coded treatment minutes: 8 28    Total treatment minutes (timed + untimed) 41 28    Response to Treatment Pt presents with c/c of R sided back pain. Pain presents to be in R sided thoracic region and inferior scapula. Deficits include painful cervical ROM, painful R  shoulder strength, poor scapular stabilizing strength noted, and hypomobility noted in upper thoracic spine. Functional reported difficulties include pain while playing instrument for [employer]. Pt is a good candidate for skilled OP PT to address deficits noted above.  Pt demonstrates improved postural awareness throughout exercises performed this date compared to evaluation. Cues initially required to improve scapular retraction during standing rows. Pt reports minor pain with cervical SNAGs.       Daily patient education  The patient received education on the purpose of therapy, participated in the development of the POC and verbalized understanding and agreement of POC, goals.   HEP updated (see above), RTB provided for exercise progression       Plan for Next Visit:  Pt will follow up with PT for 2x/week for 4 weeks. Pt will benefit from skilled PT to address the impairments and functional limitations listed above in order to return to PLOF without pain or limitations. PT will plan to progress towards discharge to independent HEP as functional and objective goals are met.    Progress R scapular strength, generalized posterior chain strength     Progress pain free L cervical rotation ROM  Progress R scapular strength, generalized posterior chain strength     Progress pain free L cervical rotation ROM     Consider manual STM to R periscapular muscles/ B UT      This note serves as a Discharge Summary should the patient not return to physical therapy per the above plan of care.  __________________________________________________________________________________________________

## 2023-12-12 ENCOUNTER — Encounter: Admit: 2023-12-12 | Discharge: 2023-12-12 | Payer: Medicaid (Managed Care)

## 2023-12-12 DIAGNOSIS — G8929 Other chronic pain: Secondary | ICD-10-CM

## 2023-12-12 NOTE — Progress Notes (Signed)
 Physical Therapy Daily Treatment Note    Name: Devin Miller     Date of Birth: September 19, 1996      MRN: [id number]    Date of evaluation: 12/03/2023  Referring Physician: Izora Gala, MD  [phone]  [name], [address]rd floor  [place],  [place] [address] Phone: [phone] Fax: [phone]  Specific Order: Evaluate and treat   Primary Medical Diagnosis:   1. Chronic right-sided thoracic back pain            Insurance: Payor: [employer] MEDICAID / Plan: [employer] [place] / Product Type: Medicaid Mngd care /     Subjective/History:  Date of Onset: ~chronic   History of Current Problem/Reason for Referral: Takuma Chanz Cahall is a 28 y.o. male who presents today with chief complaint of   chronic back pain. Pain is underneath R shoulder blade. 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 Rest is easing. Denies B UE/LE symptoms. He also reports neck pain when shoulder blade pain increases. 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 Pt is R handed.     # of visits per insurance authorization:PT OT ST GETS 30 V EACH PCY, USED 0  # of visits per POC: 8    Therapy Goals:           12/03/2023   Therapy Goals   Goal:  Patient will report improvement in AVERAGE pain in past week to demonstrate improved pain management allowing for increased functional mobility/ tolerance. Not Met   Goal: Patient will report improvement  in WORST reported pain to demonstrate improved pain management allowing for increased functional mobility/ tolerance. Not met   Goal: Patient demonstrates independence with HEP through demonstration/ teach back review to maintain benefits gained in therapy Not met   Goal:  Not Met   Comment: Goal: improve score by 10% to reflect decreased back pain during ADLs, work tasks, and functional mobility   Short Term Goal - Patient will  improve cervical ROM deficit by: 5 Degrees   To support comfort/performance with: Work Tasks   In: 4 weeks   Status: Not Met   Short Term Goal - Patient will improve lumbar ROM deficit by: 20% or better   To support comfort/performance with: Work Immunologist;Sitting   In: 4 weeks   Status: Not Met   Short Term Goal - Patient will improve UE strength deficit by: 0.5 Muscle Grade   To support comfort/performance with: Overhead Reach;Recreational Activities;Work Tasks   In: 4 weeks   Status: Not Met   Short Term Goal - Patient will improve deficient movement screen components including: Cervical Rotation   To support comfort/performance with: Functional-Painfree   In: 4 weeks to support return to functional mobility.   Status: Not Met          Referring Provider:Holliday, Casimiro Needle, MD     Verified Signed POC:    YES[x]       NO[]            Date:     Attestation signed by Izora Gala, MD at 12/06/2023  2:31 PM          If no, action taken: Rerouted via  EMR[]   Fax[]    Mail[]       Date:  Updated POC date:        Verified Signed POC:  YES[]       NO[]            Date:   If no, action taken: Rerouted via  EMR[]   Fax[]    Mail[]       Date:    Primary Therapist:  Rozanna Boer, PT       Precautions: hx of DVT R LE (MD aware, pt wears compression stockings), HTN managed with medication per pt (please refer to PMH above)   Date:    12/03/2023   Date: 12/10/23 Date: 12/12/23   Visit #    Recommended  # visits/ frequency: 2x per week for 4 weeks      Eval completed today   1 of 8 2 of 8   Subjective or Additional Info: See Evaluation Above    Pain:4 /10    Location: R thoracic pain, inferior scapula Pt reports the exercises are providing some pain 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      Pain: 0/10    Location: R thoracic pain, inferior scapula  Pt states his exercises are helping and his back  is feeling a little better. Does still have pain when playing instrument.          Therapeutic Exercise: 97110  Pt received therapeutic exercise for the following:  restoring or maintaining strength, restoring or maintaining endurance, restoring or maintaining range of motion, restoring or maintaining flexibility HEP:   Seated chin tucks 2 x 10  Seated rev shoulder rolls x 20   Seated HABD with YTB 2 x 10              UBE level 1 - 1.5 min fwd and 1.5 min rev (3 min total)     Standing rows RTB 2 x 10 (+HEP)    Standing shoulder extension RTB 2 x 10 (+HEP)      Wall slide + LT lift off 2 x 10     Cervical SNAG x 10 bilaterally (+HEP)    Seated banded shoulder ER RTB  2 x 10     Seated clasped shoulder flexion for thoracic mobility 2 x 10     B UT stretch 2 x 30 sec                    UBE level 1 - 1.5 min fwd and 1.5 min rev (3 min total)     Standing rows RTB x 20    Standing shoulder extension RTB x 20    Wall slide + LT lift off x 10     Seated banded shoulder ER RTB  2 x 10     Cat/cow x 10 ea way     Standing bear hug RTB at scap x 10    Time (minutes) 8 28 18      Manual Therapy:  97140  Pt received manual therapy for the following: improve restricted and/or painful movement of soft tissue, improve restricted and/or painful movement of joint and/or spinal segment, Improve swelling/edema          Prone STM to R UT, levator, R rhomboids and thoracic paraspinals    Supine SOR    Seated manual active assisted cervical SNAGs 2 x 5 ea way  Time (minutes)    12     Neuromuscular Re-education: 97112  Pt received neuromuscular re-education for the following: restoring or maintaining balance, restoring or maintaining coordination,, restoring or maintaining kinesthetic sense,, restoring or maintaining posture, restoring or maintaining proprioception         Time (minutes)        Therapeutic Activities: 97530  Pt received therapeutic activities for the following: use of  functional activities to restore or maintain functional performance         Time (minutes)      Modalities    MHP prone over R shoulder blade x 10     Time (minutes)   10     PT Low Complexity Eval: 29518      Pt received Low complexity evaluation- refer to evaluation and POC           Total timed coded treatment minutes: 8 28 30    Total treatment minutes (timed + untimed) 41 28 40   Response to Treatment Pt presents with c/c of R sided back pain. Pain presents to be in R sided thoracic region and inferior scapula. Deficits include painful cervical ROM, painful R shoulder strength, poor scapular stabilizing strength noted, and hypomobility noted in upper thoracic spine. Functional reported difficulties include pain while playing instrument for [employer]. Pt is a good candidate for skilled OP PT to address deficits noted above.  Pt demonstrates improved postural awareness throughout exercises performed this date compared to evaluation. Cues initially required to improve scapular retraction during standing rows. Pt reports minor pain with cervical SNAGs.  Single cue provided for scap retraction during rows and pt implemented well. Fatigued with low trap lift off at wall. Initiated STM and tightness found over R periscapular border and R UT. Minor increase in cervical rotation ROM with manual SNAGs.      Daily patient education  The patient received education on the purpose of therapy, participated in the development of the POC and verbalized understanding and agreement of POC, goals.   HEP updated (see above), RTB provided for exercise progression  Educated pt to continue with current HEP.      Plan for Next Visit:  Pt will follow up with PT for 2x/week for 4 weeks. Pt will benefit from skilled PT to address the impairments and functional limitations listed above in order to return to PLOF without pain or limitations. PT will plan to progress towards discharge to independent HEP as functional and objective  goals are met.    Progress R scapular strength, generalized posterior chain strength     Progress pain free L cervical rotation ROM  Progress R scapular strength, generalized posterior chain strength     Progress pain free L cervical rotation ROM     Consider manual STM to R periscapular muscles/ B UT Continue manuals as indicated.     Progress R scapular strength, generalized posterior chain strength     Progress pain free L cervical rotation ROM      This note serves as a Discharge Summary should the patient not return to physical therapy per the above plan of care.  __________________________________________________________________________________________________

## 2023-12-19 ENCOUNTER — Encounter: Admit: 2023-12-19 | Discharge: 2023-12-19 | Payer: Medicaid (Managed Care)

## 2023-12-19 DIAGNOSIS — G8929 Other chronic pain: Secondary | ICD-10-CM

## 2023-12-19 NOTE — Progress Notes (Signed)
 Physical Therapy Daily Treatment Note    Name: Devin Miller     Date of Birth: [birth year]      MRN: [id number]    Date of evaluation: 12/03/2023  Referring Physician: Izora Gala, MD  [phone]  [name], [address]rd floor  [place],  [place] [address] Phone: [phone] Fax: [phone]  Specific Order: Evaluate and treat   Primary Medical Diagnosis:   1. Chronic right-sided thoracic back pain            Insurance: Payor: [employer]GED MEDICAID / Plan: [employer] [place] / Product Type: Medicaid Mng[place] care /     Subjective/History:  Date of Onset: ~chronic   History of Current Problem/Reason for Referral: Devin Miller is a 28 y.o. male who presents today with chief complaint of   chronic back pain. Pain is underneath R shoulder blade. 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 Rest is easing. Denies B UE/LE symptoms. He also reports neck pain when shoulder blade pain increases. 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 Pt is R handed.     # of visits per insurance authorization:PT OT ST GETS 30 V EACH PCY, USED 0  # of visits per POC: 8    Therapy Goals:           12/03/2023   Therapy Goals   Goal:  Patient will report improvement in AVERAGE pain in past week to demonstrate improved pain management allowing for increased functional mobility/ tolerance. Not Met   Goal: Patient will report improvement  in WORST reported pain to demonstrate improved pain management allowing for increased functional mobility/ tolerance. Not met   Goal: Patient demonstrates independence with HEP through demonstration/ teach back review to maintain benefits gained in therapy Not met   Goal:  Not Met   Comment: Goal: improve score by 10% to reflect decreased back pain during ADLs, work tasks, and functional mobility   Short Term Goal - Patient will  improve cervical ROM deficit by: 5 Degrees   To support comfort/performance with: Work Tasks   In: 4 weeks   Status: Not Met   Short Term Goal - Patient will improve lumbar ROM deficit by: 20% or better   To support comfort/performance with: Work Immunologist;Sitting   In: 4 weeks   Status: Not Met   Short Term Goal - Patient will improve UE strength deficit by: 0.5 Muscle Grade   To support comfort/performance with: Overhead Reach;Recreational Activities;Work Tasks   In: 4 weeks   Status: Not Met   Short Term Goal - Patient will improve deficient movement screen components including: Cervical Rotation   To support comfort/performance with: Functional-Painfree   In: 4 weeks to support return to functional mobility.   Status: Not Met          Referring Provider:Holliday, Casimiro Needle, MD     Verified Signed POC:    Sherle Poe       NO[]            Date:     Attestation signed by Izora Gala, MD at 12/06/2023  2:31 PM          If no, action taken: Rerouted via  EMR[]   Fax[]    Mail[]       Date:  Updated POC date:        Verified Signed POC:  YES[]       NO[]            Date:   If no, action taken: Rerouted via  EMR[]   Fax[]    Mail[]       Date:    Primary Therapist:  Rozanna Boer, PT       Precautions: hx of DVT R LE (MD aware, pt wears compression stockings), HTN managed with medication per pt (please refer to PMH above)   Date:    12/03/2023   Date: 12/10/23 Date: 12/12/23 Date: 12/19/23   Visit #    Recommended  # visits/ frequency: 2x per week for 4 weeks      Eval completed today   1 of 8 2 of 8 3 of 8   Subjective or Additional Info: See Evaluation Above    Pain:4 /10    Location: R thoracic pain, inferior scapula Pt reports the exercises are providing some pain 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      Pain: 0/10    Location: R thoracic pain, inferior scapula  Pt states his exercises are  helping and his back is feeling a little better. Does still have pain when playing instrument.  Pt reports his concert went well, he did have some pain during but not nearly as bad as before. Overall back is feeling much better.           Therapeutic Exercise: 97110  Pt received therapeutic exercise for the following:  restoring or maintaining strength, restoring or maintaining endurance, restoring or maintaining range of motion, restoring or maintaining flexibility HEP:   Seated chin tucks 2 x 10  Seated rev shoulder rolls x 20   Seated HABD with YTB 2 x 10              UBE level 1 - 1.5 min fwd and 1.5 min rev (3 min total)     Standing rows RTB 2 x 10 (+HEP)    Standing shoulder extension RTB 2 x 10 (+HEP)      Wall slide + LT lift off 2 x 10     Cervical SNAG x 10 bilaterally (+HEP)    Seated banded shoulder ER RTB  2 x 10     Seated clasped shoulder flexion for thoracic mobility 2 x 10     B UT stretch 2 x 30 sec                    UBE level 1 - 1.5 min fwd and 1.5 min rev (3 min total)     Standing rows RTB x 20    Standing shoulder extension RTB x 20    Wall slide + LT lift off x 10     Seated banded shoulder ER RTB  2 x 10     Cat/cow x 10 ea way     Standing bear hug RTB at scap x 10 UBE level 2.0 x 2 min fwd and rev    Standing rows RTB x 20    Standing shoulder extension RTB x 20    YTB anterior anchor low trap Y raise 2 x 10     R biceps stretch at wall and at corner of table, 2 x 30 sec of each (+HEP)    Wall angels x 10  Prone W x 15    Prone T x 10         Time (minutes) 8 28 18 18      Manual Therapy:  97140  Pt received manual therapy for the following: improve restricted and/or painful movement of soft tissue, improve restricted and/or painful movement of joint and/or spinal segment, Improve swelling/edema          Prone STM to R UT, levator, R rhomboids and thoracic paraspinals    Supine SOR    Seated manual active assisted cervical SNAGs 2 x 5 ea way   Prone STM to R UT, levator, R rhomboids  and thoracic paraspinals, R low trap, infraspinatus     Supine SOR                                              Time (minutes)    12 11     Neuromuscular Re-education: 97112  Pt received neuromuscular re-education for the following: restoring or maintaining balance, restoring or maintaining coordination,, restoring or maintaining kinesthetic sense,, restoring or maintaining posture, restoring or maintaining proprioception          Time (minutes)         Therapeutic Activities: 97530  Pt received therapeutic activities for the following: use of functional activities to restore or maintain functional performance          Time (minutes)       Modalities    MHP prone over R shoulder blade x 10      Time (minutes)   10      PT Low Complexity Eval: 78295      Pt received Low complexity evaluation- refer to evaluation and POC             Total timed coded treatment minutes: 8 28 30 29    Total treatment minutes (timed + untimed) 41 28 40 29   Response to Treatment Pt presents with c/c of R sided back pain. Pain presents to be in R sided thoracic region and inferior scapula. Deficits include painful cervical ROM, painful R shoulder strength, poor scapular stabilizing strength noted, and hypomobility noted in upper thoracic spine. Functional reported difficulties include pain while playing instrument for [facility]. Pt is a good candidate for skilled OP PT to address deficits noted above.  Pt demonstrates improved postural awareness throughout exercises performed this date compared to evaluation. Cues initially required to improve scapular retraction during standing rows. Pt reports minor pain with cervical SNAGs.  Single cue provided for scap retraction during rows and pt implemented well. Fatigued with low trap lift off at wall. Initiated STM and tightness found over R periscapular border and R UT. Minor increase in cervical rotation ROM with manual SNAGs.  Pt progressing well and demonstrates improved form and  less shaking with rows and extensions. Challenged with prone T's, banded low trap raise, and wall angels though tolerated well without pain. Continued tightness over R periscapular mm with STM.      Daily patient education  The patient received education on the purpose of therapy, participated in the development of the POC and verbalized understanding and agreement of POC, goals.   HEP updated (see above), RTB provided for exercise progression  Educated pt to continue with current HEP.  Educated pt over bicep stretch for RUE to do before and after playing  instrument      Plan for Next Visit:  Pt will follow up with PT for 2x/week for 4 weeks. Pt will benefit from skilled PT to address the impairments and functional limitations listed above in order to return to PLOF without pain or limitations. PT will plan to progress towards discharge to independent HEP as functional and objective goals are met.    Progress R scapular strength, generalized posterior chain strength     Progress pain free L cervical rotation ROM  Progress R scapular strength, generalized posterior chain strength     Progress pain free L cervical rotation ROM     Consider manual STM to R periscapular muscles/ B UT Continue manuals as indicated.     Progress R scapular strength, generalized posterior chain strength     Progress pain free L cervical rotation ROM  Continue manuals as indicated.     Progress R scapular strength, generalized posterior chain strength     Progress pain free L cervical rotation ROM      This note serves as a Discharge Summary should the patient not return to physical therapy per the above plan of care.  __________________________________________________________________________________________________

## 2023-12-24 ENCOUNTER — Encounter: Admit: 2023-12-24 | Discharge: 2023-12-24 | Payer: Medicaid (Managed Care)

## 2023-12-24 DIAGNOSIS — G8929 Other chronic pain: Secondary | ICD-10-CM

## 2023-12-24 NOTE — Progress Notes (Signed)
 Physical Therapy Daily Treatment Note    Name: Devin Miller     Date of Birth: 03/09/1996      MRN: [id number]    Date of evaluation: 12/03/2023  Referring Physician: Izora Gala, MD  [phone]  [name], [address]rd floor  [place],  [place] [address] Phone: [phone] Fax: [phone]  Specific Order: Evaluate and treat   Primary Medical Diagnosis:   1. Chronic right-sided thoracic back pain            Insurance: Payor: [employer]ICA[employer] / Plan: [employer] [place] / Product Type: Medicaid [place] care /     Subjective/History:  Date of Onset: ~chronic   History of Current Problem/Reason for Referral: Devin Miller is a 28 y.o. male who presents today with chief complaint of   chronic back pain. Pain is underneath R shoulder blade. 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 Rest is easing. Denies B UE/LE symptoms. He also reports neck pain when shoulder blade pain increases. 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 Pt is R handed.     # of visits per insurance authorization:PT OT ST GETS 30 V EACH PCY, USED 0  # of visits per POC: 8    Therapy Goals:           12/03/2023   Therapy Goals   Goal:  Patient will report improvement in AVERAGE pain in past week to demonstrate improved pain management allowing for increased functional mobility/ tolerance. Not Met   Goal: Patient will report improvement  in WORST reported pain to demonstrate improved pain management allowing for increased functional mobility/ tolerance. Not met   Goal: Patient demonstrates independence with HEP through demonstration/ teach back review to maintain benefits gained in therapy Not met   Goal:  Not Met   Comment: Goal: improve score by 10% to reflect decreased back pain during ADLs, work tasks, and functional mobility   Short Term Goal - Patient will  improve cervical ROM deficit by: 5 Degrees   To support comfort/performance with: Work Tasks   In: 4 weeks   Status: Not Met   Short Term Goal - Patient will improve lumbar ROM deficit by: 20% or better   To support comfort/performance with: Work Immunologist;Sitting   In: 4 weeks   Status: Not Met   Short Term Goal - Patient will improve UE strength deficit by: 0.5 Muscle Grade   To support comfort/performance with: Overhead Reach;Recreational Activities;Work Tasks   In: 4 weeks   Status: Not Met   Short Term Goal - Patient will improve deficient movement screen components including: Cervical Rotation   To support comfort/performance with: Functional-Painfree   In: 4 weeks to support return to functional mobility.   Status: Not Met          Referring Provider:Holliday, Casimiro Needle, MD     Verified Signed POC:    YES[x]       NO[]            Date:     Attestation signed by Izora Gala, MD at 12/06/2023  2:31 PM          If no, action taken: Rerouted via  EMR[]   Fax[]    Mail[]       Date:  Updated POC date:        Verified Signed POC:  YES[]       NO[]            Date:   If no, action taken: Rerouted via  EMR[]   Fax[]    Mail[]       Date:    Primary Therapist:  Rozanna Boer, PT       Precautions: hx of DVT R LE (MD aware, pt wears compression stockings), HTN managed with medication per pt (please refer to PMH above)   Date:    12/03/2023   Date: 12/10/23 Date: 12/12/23 Date: 12/19/23 Date: 12/24/23   Visit #    Recommended  # visits/ frequency: 2x per week for 4 weeks      Eval completed today   1 of 8 2 of 8 3 of 8 4 of 8    Subjective or Additional Info: See Evaluation Above    Pain:4 /10    Location: R thoracic pain, inferior scapula Pt reports the exercises are providing some pain 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      Pain: 0/10    Location: R thoracic pain, inferior scapula  Pt  states his exercises are helping and his back is feeling a little better. Does still have pain when playing instrument.  Pt reports his concert went well, he did have some pain during but not nearly as bad as before. Overall back is feeling much better.  Pt reports pain is a lot better especially while playing his music. He sometimes feels it if he is walking a lot too. He's nervous about these next 2 weeks because he is going to be playing a lot.            Therapeutic Exercise: 97110  Pt received therapeutic exercise for the following:  restoring or maintaining strength, restoring or maintaining endurance, restoring or maintaining range of motion, restoring or maintaining flexibility HEP:   Seated chin tucks 2 x 10  Seated rev shoulder rolls x 20   Seated HABD with YTB 2 x 10              UBE level 1 - 1.5 min fwd and 1.5 min rev (3 min total)     Standing rows RTB 2 x 10 (+HEP)    Standing shoulder extension RTB 2 x 10 (+HEP)      Wall slide + LT lift off 2 x 10     Cervical SNAG x 10 bilaterally (+HEP)    Seated banded shoulder ER RTB  2 x 10     Seated clasped shoulder flexion for thoracic mobility 2 x 10     B UT stretch 2 x 30 sec                    UBE level 1 - 1.5 min fwd and 1.5 min rev (3 min total)     Standing rows RTB x 20    Standing shoulder extension RTB x 20    Wall slide + LT lift off x 10     Seated banded shoulder ER RTB  2 x 10     Cat/cow x 10 ea way     Standing bear hug RTB at scap x 10 UBE level 2.0 x 2 min fwd and rev    Standing rows RTB x 20    Standing shoulder  extension RTB x 20    YTB anterior anchor low trap Y raise 2 x 10     R biceps stretch at wall and at corner of table, 2 x 30 sec of each (+HEP)    Wall angels x 10     Prone W x 15    Prone T x 10      UBE level 2.0 x 2 min fwd and rev    Standing rows GTB 2 x 15     R shoulder ER at 90-90 YTB 2 x 10    R ER neutral shoulder YTB 2 x 10    Push up plus on wall (no push up) 2 x 10     Bent over row with 5 lb weight, B UE 2 x 10      Wall angels 2 x 10     Review of seated UT and LS stretch, bicep stretch on wall, pec stretch in door way             Time (minutes) 8 28 18 18 27      Manual Therapy:  97140  Pt received manual therapy for the following: improve restricted and/or painful movement of soft tissue, improve restricted and/or painful movement of joint and/or spinal segment, Improve swelling/edema          Prone STM to R UT, levator, R rhomboids and thoracic paraspinals    Supine SOR    Seated manual active assisted cervical SNAGs 2 x 5 ea way   Prone STM to R UT, levator, R rhomboids and thoracic paraspinals, R low trap, infraspinatus     Supine SOR Prone STM to R UT, levator, R rhomboids and thoracic paraspinals, R low trap, infraspinatus     Supine SOR                                              Time (minutes)    12 11 12      Neuromuscular Re-education: 97112  Pt received neuromuscular re-education for the following: restoring or maintaining balance, restoring or maintaining coordination,, restoring or maintaining kinesthetic sense,, restoring or maintaining posture, restoring or maintaining proprioception           Time (minutes)          Therapeutic Activities: 97530  Pt received therapeutic activities for the following: use of functional activities to restore or maintain functional performance           Time (minutes)        Modalities    MHP prone over R shoulder blade x 10       Time (minutes)   10       PT Low Complexity Eval: 54098      Pt received Low complexity evaluation- refer to evaluation and POC               Total timed coded treatment minutes: 8 28 30 29  39   Total treatment minutes (timed + untimed) 41 28 40 29 39   Response to Treatment Pt presents with c/c of R sided back pain. Pain presents to be in R sided thoracic region and inferior scapula. Deficits include painful cervical ROM, painful R shoulder strength, poor scapular stabilizing strength noted, and hypomobility noted in upper thoracic spine. Functional  reported difficulties include pain while playing instrument for [employer].  Pt is a good candidate for skilled OP PT to address deficits noted above.  Pt demonstrates improved postural awareness throughout exercises performed this date compared to evaluation. Cues initially required to improve scapular retraction during standing rows. Pt reports minor pain with cervical SNAGs.  Single cue provided for scap retraction during rows and pt implemented well. Fatigued with low trap lift off at wall. Initiated STM and tightness found over R periscapular border and R UT. Minor increase in cervical rotation ROM with manual SNAGs.  Pt progressing well and demonstrates improved form and less shaking with rows and extensions. Challenged with prone T's, banded low trap raise, and wall angels though tolerated well without pain. Continued tightness over R periscapular mm with STM.  Pt demonstrates good scapular control of protraction/retraction during exercises. He reports increased challenge with added resistance during rows this date. TTP noted R UT and LS during STM.     Daily patient education  The patient received education on the purpose of therapy, participated in the development of the POC and verbalized understanding and agreement of POC, goals.   HEP updated (see above), RTB provided for exercise progression  Educated pt to continue with current HEP.  Educated pt over bicep stretch for RUE to do before and after playing instrument  Discussed scapular control during push up plus exercise      Plan for Next Visit:  Pt will follow up with PT for 2x/week for 4 weeks. Pt will benefit from skilled PT to address the impairments and functional limitations listed above in order to return to PLOF without pain or limitations. PT will plan to progress towards discharge to independent HEP as functional and objective goals are met.    Progress R scapular strength, generalized posterior chain strength     Progress pain free L  cervical rotation ROM  Progress R scapular strength, generalized posterior chain strength     Progress pain free L cervical rotation ROM     Consider manual STM to R periscapular muscles/ B UT Continue manuals as indicated.     Progress R scapular strength, generalized posterior chain strength     Progress pain free L cervical rotation ROM  Continue manuals as indicated.     Progress R scapular strength, generalized posterior chain strength     Progress pain free L cervical rotation ROM  Continue manuals as indicated.     Progress R scapular strength, generalized posterior chain strength     Progress pain free L cervical rotation ROM      This note serves as a Discharge Summary should the patient not return to physical therapy per the above plan of care.  __________________________________________________________________________________________________

## 2023-12-25 ENCOUNTER — Ambulatory Visit: Admit: 2023-12-25 | Discharge: 2023-12-25 | Payer: Medicaid (Managed Care)

## 2023-12-25 DIAGNOSIS — I10 Essential (primary) hypertension: Secondary | ICD-10-CM

## 2023-12-25 MED ORDER — amLODIPine (NORVASC) 10 MG tablet
10 | ORAL_TABLET | Freq: Every day | ORAL | 1 refills | 30.00000 days
Start: 2023-12-25 — End: 2024-02-13

## 2023-12-25 NOTE — Patient Instructions (Addendum)
 VISIT SUMMARY:  Today, we discussed your blood pressure management and right upper back pain. We reviewed your current medications and lifestyle habits, and made some adjustments to help you achieve better health outcomes.    YOUR PLAN:  -HYPERTENSION: Hypertension means high blood pressure. Your home readings show that your blood pressure is not yet at the target level. We will increase your amlodipine dose to 10 mg daily while continuing valsartan 80 mg daily. Please monitor for any side effects like swelling or lightheadedness. Continue with dietary changes to reduce simple sugars and sodium, and increase your physical activity. We also discussed the DASH diet, which can help manage blood pressure. We will follow up in one month to reassess your blood pressure and medication effectiveness.    -RIGHT UPPER BACK PAIN: Your right upper back pain has improved significantly with physical therapy, and you can continue your activities without difficulty. Please continue with physical therapy as needed.    -LYMPHEDEMA: Lymphedema is swelling due to fluid build-up, often in the arms or legs. You have chronic lymphedema, with the right side being worse than the left. Continue using your compression socks to manage this condition.    INSTRUCTIONS:  Please follow up in one month to reassess your blood pressure and the effectiveness of your medication adjustments.              Contains text generated by [employer].

## 2023-12-25 NOTE — Progress Notes (Signed)
 Subjective   Devin Miller is a 28 y.o. male who presents for Follow-up (Feeling better)  History of Present Illness  Devin Miller is a 28 year old male with hypertension who presents for follow-up on blood pressure management and right upper back pain.    He has hypertension and is currently taking amlodipine 5 mg and valsartan 80 mg daily. Home blood pressure monitoring shows systolic readings between 135 to 147 mmHg and diastolic readings from 71 to 88 mmHg. He takes his medications every morning without difficulty. No heartburn or lightheadedness. He is working on dietary changes to support blood pressure management, including reducing sugary intake and increasing water consumption.    He presents for follow-up regarding right upper back pain, which has improved significantly with physical therapy. 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 No breathing issues reported.    He has a history of deep vein thrombosis, with a previous normal D-dimer test. 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 He denies regular use of NSAIDs.    In terms of lifestyle, he occasionally consumes alcohol, about once every three weeks, typically two to three drinks. He exercises regularly, doing 30 minutes on the elliptical and using machines to strengthen his back. He is meeting with a trainer to enhance his exercise regimen. No history of sleep apnea.    Review of Systems    Objective   Blood pressure 157/88, pulse 94, temperature 96.9 F (36.1 C), temperature source Temporal, resp. rate 18, height 5' 11 (1.803 m), weight (!) 283 lb (128.4 kg), SpO2 98%.   12/04/2023 12/05/2023 12/06/2023 12/07/2023   MyChart Blood Pressure Flowsheet       Systolic 141 (PR) 136 (PR) 141 (PR) 135 (PR)   Systolic 143 (PR) 147 (PR) 137 (PR)    Diastolic 87 (PR) 71 (PR) 78 (PR) 85 (PR)   Diastolic 75 (PR) 88 (PR) 86 (PR)       Legend:  (PR) Patient-reported  Physical  Exam  CHEST: Lungs clear to auscultation.  Heart: regular rate and rhythm with no murmur, gallops or rubs.     MUSCULOSKELETAL: Less tenderness in right upper thoracic region. Chronic lymphedema R>L. Stockings in place.     Results  LABS  D-dimer: normal       Assessment & Plan  Hypertension: relatively low renin  Blood pressure readings at home show systolic values between 135-147 mmHg and diastolic values between 71-88 mmHg, indicating hypertension not yet at goal. Currently on amlodipine 5 mg and valsartan 80 mg.  - Increase amlodipine to 10 mg daily to achieve target blood pressure below 130/80 mmHg  - Continue valsartan 80 mg daily  - Monitor for side effects such as swelling and lightheadedness  - Implement dietary changes to reduce simple sugars and sodium  - Increase physical activity  - Discussed DASH diet and its benefits in managing blood pressure  - Follow up in one month to reassess blood pressure and medication efficacy    Right upper back pain  Significant improvement in right upper back pain symptoms following physical therapy. No issues with breathing or other complications. Previous D-dimer test was normal, ruling out concerns of a clot.  - Continue physical therapy as needed    Lymphedema  Chronic lymphedema with compression socks in use. Right side worse than left.  - Continue use of compression socks         Attestation

## 2023-12-26 NOTE — Progress Notes (Signed)
 OUTPATIENT THERAPY NO SHOW/ CANCEL NOTE    The Outpatient Therapy Attendance Policy which states 2 consecutive no-shows or 3 missed sessions within the last 30 days may result in discharge from therapy. Cancellation less than 24 hours before the appointment time is considered a no-show. Additionally, the policy states therapy may be unable to be provided if patient arrives late by 15 minutes or more.      DATE Type of Cancellation Telephone Contact?   12/26/23 Misunderstanding regarding apt time. Pt arrived late; therapist unable to see.  In person, pt understanding.                  Thomes Dinning, PT  12/26/2023

## 2024-01-09 ENCOUNTER — Encounter: Admit: 2024-01-09 | Discharge: 2024-01-09 | Payer: Medicaid (Managed Care)

## 2024-01-09 DIAGNOSIS — G8929 Other chronic pain: Secondary | ICD-10-CM

## 2024-01-09 DIAGNOSIS — M546 Pain in thoracic spine: Secondary | ICD-10-CM

## 2024-01-09 NOTE — Progress Notes (Signed)
 Physical Therapy Daily Treatment Note    Name: Berish Bohman     Date of Birth: Aug 03, 1996      MRN: [id number]    Date of evaluation: 12/03/2023  Referring Physician: Izora Gala, MD  [phone]  [name], [address]rd floor  [place],  [place] [address] Phone: [phone] Fax: [phone]  Specific Order: Evaluate and treat   Primary Medical Diagnosis:   1. Chronic right-sided thoracic back pain            Insurance: Payor: HU[employer] MA[employer]GED MEDICAID / Plan: [employer] HORIZONS [place] / Product Type: Medicaid M[place] care /     Subjective/History:  Date of Onset: ~chronic   History of Current Problem/Reason for Referral: Latrell Raekwan Spelman is a 28 y.o. male who presents today with chief complaint of   chronic back pain. Pain is underneath R shoulder blade. 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 Rest is easing. Denies B UE/LE symptoms. He also reports neck pain when shoulder blade pain increases. 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 Pt is R handed.     # of visits per insurance authorization:PT OT ST GETS 30 V EACH PCY, USED 0  # of visits per POC: 8    Therapy Goals:           12/03/2023   Therapy Goals   Goal:  Patient will report improvement in AVERAGE pain in past week to demonstrate improved pain management allowing for increased functional mobility/ tolerance. Not Met   Goal: Patient will report improvement  in WORST reported pain to demonstrate improved pain management allowing for increased functional mobility/ tolerance. Not met   Goal: Patient demonstrates independence with HEP through demonstration/ teach back review to maintain benefits gained in therapy Not met   Goal:  Not Met   Comment: Goal: improve score by 10% to reflect decreased back pain during ADLs, work tasks, and functional mobility   Short Term Goal - Patient will  improve cervical ROM deficit by: 5 Degrees   To support comfort/performance with: Work Tasks   In: 4 weeks   Status: Not Met   Short Term Goal - Patient will improve lumbar ROM deficit by: 20% or better   To support comfort/performance with: Work Immunologist;Sitting   In: 4 weeks   Status: Not Met   Short Term Goal - Patient will improve UE strength deficit by: 0.5 Muscle Grade   To support comfort/performance with: Overhead Reach;Recreational Activities;Work Tasks   In: 4 weeks   Status: Not Met   Short Term Goal - Patient will improve deficient movement screen components including: Cervical Rotation   To support comfort/performance with: Functional-Painfree   In: 4 weeks to support return to functional mobility.   Status: Not Met          Referring Provider:Holliday, Casimiro Needle, MD     Verified Signed POC:    YES[x]       NO[]            Date:     Attestation signed by Izora Gala, MD at 12/06/2023  2:31 PM          If no, action taken: Rerouted via  EMR[]   Fax[]    Mail[]       Date:  Updated POC date:        Verified Signed POC:  YES[]       NO[]            Date:   If no, action taken: Rerouted via  EMR[]   Fax[]    Mail[]       Date:    Primary Therapist:  Rozanna Boer, PT       Precautions: hx of DVT R LE (MD aware, pt wears compression stockings), HTN managed with medication per pt (please refer to PMH above)   Date:    12/03/2023   Date: 12/10/23 Date: 12/12/23 Date: 12/19/23 Date: 12/24/23 Date: 01/09/2024     Visit #    Recommended  # visits/ frequency: 2x per week for 4 weeks      Eval completed today   1 of 8 2 of 8 3 of 8 4 of 8  5 of 8    Subjective or Additional Info: See Evaluation Above    Pain:4 /10    Location: R thoracic pain, inferior scapula Pt reports the exercises are providing some pain 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      Pain: 0/10    Location: R thoracic pain,  inferior scapula  Pt states his exercises are helping and his back is feeling a little better. Does still have pain when playing instrument.  Pt reports his concert went well, he did have some pain during but not nearly as bad as before. Overall back is feeling much better.  Pt reports pain is a lot better especially while playing his music. He sometimes feels it if he is walking a lot too. He's nervous about these next 2 weeks because he is going to be playing a lot.  Pt states his back pain is much better lately, but continues to note pain when standing and walking for or longer.                 Therapeutic Exercise: 97110  Pt received therapeutic exercise for the following:  restoring or maintaining strength, restoring or maintaining endurance, restoring or maintaining range of motion, restoring or maintaining flexibility HEP:   Seated chin tucks 2 x 10  Seated rev shoulder rolls x 20   Seated HABD with YTB 2 x 10              UBE level 1 - 1.5 min fwd and 1.5 min rev (3 min total)     Standing rows RTB 2 x 10 (+HEP)    Standing shoulder extension RTB 2 x 10 (+HEP)      Wall slide + LT lift off 2 x 10     Cervical SNAG x 10 bilaterally (+HEP)    Seated banded shoulder ER RTB  2 x 10     Seated clasped shoulder flexion for thoracic mobility 2 x 10     B UT stretch 2 x 30 sec                    UBE level 1 - 1.5 min fwd and 1.5 min rev (3 min total)     Standing rows RTB x 20    Standing shoulder extension RTB x 20    Wall slide + LT lift off x 10     Seated banded shoulder ER RTB  2 x 10     Cat/cow x 10  ea way     Standing bear hug RTB at scap x 10 UBE level 2.0 x 2 min fwd and rev    Standing rows RTB x 20    Standing shoulder extension RTB x 20    YTB anterior anchor low trap Y raise 2 x 10     R biceps stretch at wall and at corner of table, 2 x 30 sec of each (+HEP)    Wall angels x 10     Prone W x 15    Prone T x 10      UBE level 2.0 x 2 min fwd and rev    Standing rows GTB 2 x 15     R shoulder ER  at 90-90 YTB 2 x 10    R ER neutral shoulder YTB 2 x 10    Push up plus on wall (no push up) 2 x 10     Bent over row with 5 lb weight, B UE 2 x 10     Wall angels 2 x 10     Review of seated UT and LS stretch, bicep stretch on wall, pec stretch in door way          Sci-fit stepper with upper and lower extremities x66min    Prone scap retract + extension x10    Face Pulls YTB 3x10    Wall angels 2x10    Push up plus on wall (no push up) 2 x 10     Low trap Y at wall: 2x10    Bent over row- EZ curl bar 2x10    Banded horizontal abduction: YTB 2x10    Standing open books at wall: x10ea     Time (minutes) 8 28 18 18 27 20       Manual Therapy:  97140  Pt received manual therapy for the following: improve restricted and/or painful movement of soft tissue, improve restricted and/or painful movement of joint and/or spinal segment, Improve swelling/edema          Prone STM to R UT, levator, R rhomboids and thoracic paraspinals    Supine SOR    Seated manual active assisted cervical SNAGs 2 x 5 ea way   Prone STM to R UT, levator, R rhomboids and thoracic paraspinals, R low trap, infraspinatus     Supine SOR Prone STM to R UT, levator, R rhomboids and thoracic paraspinals, R low trap, infraspinatus     Supine SOR STM R UT, R periscapular area    Supine SOR    Minimal tenderness noted   XX                                              Time (minutes)    12 11 12 10       Neuromuscular Re-education: 97112  Pt received neuromuscular re-education for the following: restoring or maintaining balance, restoring or maintaining coordination,, restoring or maintaining kinesthetic sense,, restoring or maintaining posture, restoring or maintaining proprioception             Time (minutes)            Therapeutic Activities: 97530  Pt received therapeutic activities for the following: use of functional activities to restore or maintain functional performance             Time (minutes)  Modalities    MHP prone over R shoulder blade x  10         Time (minutes)   10         PT Low Complexity Eval: 97161      Pt received Low complexity evaluation- refer to evaluation and POC                   Total timed coded treatment minutes: 8 28 30 29  39     Total treatment minutes (timed + untimed) 41 28 40 29 39     Response to Treatment Pt presents with c/c of R sided back pain. Pain presents to be in R sided thoracic region and inferior scapula. Deficits include painful cervical ROM, painful R shoulder strength, poor scapular stabilizing strength noted, and hypomobility noted in upper thoracic spine. Functional reported difficulties include pain while playing instrument for [employer]. Pt is a good candidate for skilled OP PT to address deficits noted above.  Pt demonstrates improved postural awareness throughout exercises performed this date compared to evaluation. Cues initially required to improve scapular retraction during standing rows. Pt reports minor pain with cervical SNAGs.  Single cue provided for scap retraction during rows and pt implemented well. Fatigued with low trap lift off at wall. Initiated STM and tightness found over R periscapular border and R UT. Minor increase in cervical rotation ROM with manual SNAGs.  Pt progressing well and demonstrates improved form and less shaking with rows and extensions. Challenged with prone T's, banded low trap raise, and wall angels though tolerated well without pain. Continued tightness over R periscapular mm with STM.  Pt demonstrates good scapular control of protraction/retraction during exercises. He reports increased challenge with added resistance during rows this date. TTP noted R UT and LS during STM. Continued emphasis on scapular strengthening, thoracic mobility. Did not report any tenderness during manuals and was able to tolerate strengthening progressions without symptoms.       Daily patient education  The patient received education on the purpose of therapy, participated in the  development of the POC and verbalized understanding and agreement of POC, goals.   HEP updated (see above), RTB provided for exercise progression  Educated pt to continue with current HEP.  Educated pt over bicep stretch for RUE to do before and after playing instrument  Discussed scapular control during push up plus exercise        Plan for Next Visit:  Pt will follow up with PT for 2x/week for 4 weeks. Pt will benefit from skilled PT to address the impairments and functional limitations listed above in order to return to PLOF without pain or limitations. PT will plan to progress towards discharge to independent HEP as functional and objective goals are met.    Progress R scapular strength, generalized posterior chain strength     Progress pain free L cervical rotation ROM  Progress R scapular strength, generalized posterior chain strength     Progress pain free L cervical rotation ROM     Consider manual STM to R periscapular muscles/ B UT Continue manuals as indicated.     Progress R scapular strength, generalized posterior chain strength     Progress pain free L cervical rotation ROM  Continue manuals as indicated.     Progress R scapular strength, generalized posterior chain strength     Progress pain free L cervical rotation ROM  Continue manuals as indicated.     Progress R scapular  strength, generalized posterior chain strength     Progress pain free L cervical rotation ROM  Discontinue manuals NV if able.    Continue PRE scapular strengthening as tolerated.      This note serves as a Discharge Summary should the patient not return to physical therapy per the above plan of care.  __________________________________________________________________________________________________

## 2024-01-14 ENCOUNTER — Encounter: Admit: 2024-01-14 | Discharge: 2024-01-14 | Payer: Medicaid (Managed Care)

## 2024-01-14 DIAGNOSIS — G8929 Other chronic pain: Secondary | ICD-10-CM

## 2024-01-14 NOTE — Progress Notes (Signed)
 Physical Therapy Daily Treatment Note    Name: Devin Miller     Date of Birth: 08-02-1996      MRN: [id number]    Date of evaluation: 12/03/2023  Referring Physician: Izora Gala, MD  [phone]  [name], [address]rd floor  [place],  [place] [address] Phone: [phone] Fax: [phone]  Specific Order: Evaluate and treat   Primary Medical Diagnosis:   1. Chronic right-sided thoracic back pain            Insurance: Payor: HU[employer] MANAGED MEDICAID / Plan: [employer] [place] / Product Type: Medicaid [place] care /     Subjective/History:  Date of Onset: ~chronic   History of Current Problem/Reason for Referral: Devin Miller is a 28 y.o. male who presents today with chief complaint of   chronic back pain. Pain is underneath R shoulder blade. 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 Rest is easing. Denies B UE/LE symptoms. He also reports neck pain when shoulder blade pain increases. 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 Pt is R handed.     # of visits per insurance authorization:PT OT ST GETS 30 V EACH PCY, USED 0  # of visits per POC: 8    Therapy Goals:           12/03/2023   Therapy Goals   Goal:  Patient will report improvement in AVERAGE pain in past week to demonstrate improved pain management allowing for increased functional mobility/ tolerance. Not Met   Goal: Patient will report improvement  in WORST reported pain to demonstrate improved pain management allowing for increased functional mobility/ tolerance. Not met   Goal: Patient demonstrates independence with HEP through demonstration/ teach back review to maintain benefits gained in therapy Not met   Goal:  Not Met   Comment: Goal: improve score by 10% to reflect decreased back pain during ADLs, work tasks, and functional mobility   Short Term Goal - Patient will  improve cervical ROM deficit by: 5 Degrees   To support comfort/performance with: Work Tasks   In: 4 weeks   Status: Not Met   Short Term Goal - Patient will improve lumbar ROM deficit by: 20% or better   To support comfort/performance with: Work Immunologist;Sitting   In: 4 weeks   Status: Not Met   Short Term Goal - Patient will improve UE strength deficit by: 0.5 Muscle Grade   To support comfort/performance with: Overhead Reach;Recreational Activities;Work Tasks   In: 4 weeks   Status: Not Met   Short Term Goal - Patient will improve deficient movement screen components including: Cervical Rotation   To support comfort/performance with: Functional-Painfree   In: 4 weeks to support return to functional mobility.   Status: Not Met          Referring Provider:Holliday, Casimiro Needle, MD     Verified Signed POC:    YES[x]       NO[]            Date:     Attestation signed by Izora Gala, MD at 12/06/2023  2:31 PM          If no, action taken: Rerouted via  EMR[]   Fax[]    Mail[]       Date:  Updated POC date:        Verified Signed POC:  YES[]       NO[]            Date:   If no, action taken: Rerouted via  EMR[]   Fax[]    Mail[]       Date:    Primary Therapist:  CHRISTINA HAYEK, PT       Precautions: hx of DVT R LE (MD aware, pt wears compression stockings), HTN managed with medication per pt (please refer to PMH above)   Date:    12/03/2023   Date: 12/24/23 Date: 01/09/2024  01/14/24   Visit #    Recommended  # visits/ frequency: 2x per week for 4 weeks      Eval completed today   4 of 8  5 of 8 6 of 8    Subjective or Additional Info: See Evaluation Above    Pain:4 /10    Location: R thoracic pain, inferior scapula Pt reports pain is a lot better especially while playing his music. He sometimes feels it if he is walking a lot too. He's nervous about these next 2 weeks because he is going to be playing a lot.  Pt states his back pain is much better lately, but continues to note pain when standing and walking for or longer.     Patient reports that he had a little bit of back pain yesterday following playing his instrument for about an hour. He reports that he is feeling a lot better today, but he has not played yet today.           Therapeutic Exercise: 97110  Pt received therapeutic exercise for the following:  restoring or maintaining strength, restoring or maintaining endurance, restoring or maintaining range of motion, restoring or maintaining flexibility HEP:   Seated chin tucks 2 x 10  Seated rev shoulder rolls x 20   Seated HABD with YTB 2 x 10              UBE level 2.0 x 2 min fwd and rev    Standing rows GTB 2 x 15     R shoulder ER at 90-90 YTB 2 x 10    R ER neutral shoulder YTB 2 x 10    Push up plus on wall (no push up) 2 x 10     Bent over row with 5 lb weight, B UE 2 x 10     Wall angels 2 x 10     Review of seated UT and LS stretch, bicep stretch on wall, pec stretch in door way          Sci-fit stepper with upper and lower extremities x74min    Prone scap retract + extension x10    Face Pulls YTB 3x10    Wall angels 2x10    Push up plus on wall (no push up) 2 x 10     Low trap Y at wall: 2x10    Bent over row- EZ curl bar 2x10    Banded horizontal abduction: YTB 2x10    Standing open books at wall: x10ea Sci-fit Upper extremity bike x3 min forward and 3 min backward on L3     Face Pulls RTB 2 x 10     D2 flexion with RTB 2 x 10     Band pull aparts with GTB 2 x 10     Holding weighted ball (1 kg) on wall to trace alphabet x 1 each arm     Unilateral bent over row 2 x  10 with 6 pound weight (contralateral arm and leg on mat table for support)     Bent over reverse fly with 3 pound weights in each hand and tactile cues to perform appropriately 2 x 10     Unilateral shoulder extension with focus on scapular retraction 2 x 10     The patient's paper HEP was created and given to the patient this date    Time (minutes) 8 27 20  43     Manual Therapy:  97140  Pt received manual therapy for the following: improve restricted and/or  painful movement of soft tissue, improve restricted and/or painful movement of joint and/or spinal segment, Improve swelling/edema         Prone STM to R UT, levator, R rhomboids and thoracic paraspinals, R low trap, infraspinatus     Supine SOR STM R UT, R periscapular area    Supine SOR    Minimal tenderness noted   XX                                              Time (minutes)   12 10      Neuromuscular Re-education: 97112  Pt received neuromuscular re-education for the following: restoring or maintaining balance, restoring or maintaining coordination,, restoring or maintaining kinesthetic sense,, restoring or maintaining posture, restoring or maintaining proprioception          Time (minutes)         Therapeutic Activities: 97530  Pt received therapeutic activities for the following: use of functional activities to restore or maintain functional performance          Time (minutes)       Modalities         Time (minutes)         PT Low Complexity Eval: 09811      Pt received Low complexity evaluation- refer to evaluation and POC             Total timed coded treatment minutes: 8 39 30 43   Total treatment minutes (timed + untimed) 41 39 30 43   Response to Treatment Pt presents with c/c of R sided back pain. Pain presents to be in R sided thoracic region and inferior scapula. Deficits include painful cervical ROM, painful R shoulder strength, poor scapular stabilizing strength noted, and hypomobility noted in upper thoracic spine. Functional reported difficulties include pain while playing instrument for [employer]. Pt is a good candidate for skilled OP PT to address deficits noted above.  Pt demonstrates good scapular control of protraction/retraction during exercises. He reports increased challenge with added resistance during rows this date. TTP noted R UT and LS during STM. Continued emphasis on scapular strengthening, thoracic mobility. Did not report any tenderness during manuals and was able to  tolerate strengthening progressions without symptoms.  Patient with good tolerance to all exercises-he required intermittent cues (both tactile and verbal) to turn on his scapular muscles throughout his exercises. He reported feeling fatigued but had no pain following session.      Daily patient education  The patient received education on the purpose of therapy, participated in the development of the POC and verbalized understanding and agreement of POC, goals.   Discussed scapular control during push up plus exercise   Patient was educated over the importance of scapular retraction during all exercises to perform  with appropriate mechanics      Plan for Next Visit:  Pt will follow up with PT for 2x/week for 4 weeks. Pt will benefit from skilled PT to address the impairments and functional limitations listed above in order to return to PLOF without pain or limitations. PT will plan to progress towards discharge to independent HEP as functional and objective goals are met.    Progress R scapular strength, generalized posterior chain strength     Progress pain free L cervical rotation ROM  Continue manuals as indicated.     Progress R scapular strength, generalized posterior chain strength     Progress pain free L cervical rotation ROM  Discontinue manuals NV if able.    Continue PRE scapular strengthening as tolerated. Progress scapular strengthening PRE      This note serves as a Discharge Summary should the patient not return to physical therapy per the above plan of care.  __________________________________________________________________________________________________

## 2024-01-16 ENCOUNTER — Encounter: Admit: 2024-01-16 | Discharge: 2024-01-16 | Payer: Medicaid (Managed Care)

## 2024-01-16 DIAGNOSIS — G8929 Other chronic pain: Secondary | ICD-10-CM

## 2024-01-16 MED ORDER — valsartan (DIOVAN) 80 MG tablet
80 | ORAL_TABLET | Freq: Every day | ORAL | 1 refills | 30.00000 days
Start: 2024-01-16 — End: 2024-02-13

## 2024-01-16 NOTE — Progress Notes (Signed)
 Physical Therapy Daily Treatment Note    Name: Devin Miller     Date of Birth: [birth year]      MRN: [id number]    Date of evaluation: 12/03/2023  Referring Physician: Izora Gala, MD  [phone]  [name], [address]rd floor  [place],  [place] [address] Phone: [phone] Fax: [phone]  Specific Order: Evaluate and treat   Primary Medical Diagnosis:   1. Chronic right-sided thoracic back pain            Insurance: Payor: [employer] / Plan: [employer] [place] / Product Type: Medicaid [place] care /     Subjective/History:  Date of Onset: ~chronic   History of Current Problem/Reason for Referral: Devin Miller is a 28 y.o. male who presents today with chief complaint of   chronic back pain. Pain is underneath R shoulder blade. 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 Rest is easing. Denies B UE/LE symptoms. He also reports neck pain when shoulder blade pain increases. 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 Pt is R handed.     # of visits per insurance authorization:PT OT ST GETS 30 V EACH PCY, USED 0  # of visits per POC: 8    Therapy Goals:           12/03/2023   Therapy Goals   Goal:  Patient will report improvement in AVERAGE pain in past week to demonstrate improved pain management allowing for increased functional mobility/ tolerance. Not Met   Goal: Patient will report improvement  in WORST reported pain to demonstrate improved pain management allowing for increased functional mobility/ tolerance. Not met   Goal: Patient demonstrates independence with HEP through demonstration/ teach back review to maintain benefits gained in therapy Not met   Goal:  Not Met   Comment: Goal: improve score by 10% to reflect decreased back pain during ADLs, work tasks, and functional mobility   Short Term Goal - Patient will  improve cervical ROM deficit by: 5 Degrees   To support comfort/performance with: Work Tasks   In: 4 weeks   Status: Not Met   Short Term Goal - Patient will improve lumbar ROM deficit by: 20% or better   To support comfort/performance with: Work Immunologist;Sitting   In: 4 weeks   Status: Not Met   Short Term Goal - Patient will improve UE strength deficit by: 0.5 Muscle Grade   To support comfort/performance with: Overhead Reach;Recreational Activities;Work Tasks   In: 4 weeks   Status: Not Met   Short Term Goal - Patient will improve deficient movement screen components including: Cervical Rotation   To support comfort/performance with: Functional-Painfree   In: 4 weeks to support return to functional mobility.   Status: Not Met          Referring Provider:Holliday, Casimiro Needle, MD     Verified Signed POC:    YES[x]       NO[]            Date:     Attestation signed by Izora Gala, MD at 12/06/2023  2:31 PM          If no, action taken: Rerouted via  EMR[]   Fax[]    Mail[]       Date:  Updated POC date:        Verified Signed POC:  YES[]       NO[]            Date:   If no, action taken: Rerouted via  EMR[]   Fax[]    Mail[]       Date:    Primary Therapist:  Rozanna Boer, PT       Precautions: hx of DVT R LE (MD aware, pt wears compression stockings), HTN managed with medication per pt (please refer to PMH above)   Date:    12/03/2023   Date: 01/09/2024  01/14/24 01/16/24   Visit #    Recommended  # visits/ frequency: 2x per week for 4 weeks      Eval completed today   5 of 8 6 of 8  7 of 8    Subjective or Additional Info: See Evaluation Above    Pain:4 /10    Location: R thoracic pain, inferior scapula Pt states his back pain is much better lately, but continues to note pain when standing and walking for or longer.    Patient reports that he had a little bit of back pain yesterday following playing his instrument for about an hour. He reports that he is feeling a lot better today, but he has not played yet today.  Pt  reports HEP is helping to manage symptoms while playing his instrument. Symptoms increase if he is fatigued, but overall he is feeling good.           Therapeutic Exercise: 97110  Pt received therapeutic exercise for the following:  restoring or maintaining strength, restoring or maintaining endurance, restoring or maintaining range of motion, restoring or maintaining flexibility HEP:   Seated chin tucks 2 x 10  Seated rev shoulder rolls x 20   Seated HABD with YTB 2 x 10              Sci-fit stepper with upper and lower extremities x76min    Prone scap retract + extension x10    Face Pulls YTB 3x10    Wall angels 2x10    Push up plus on wall (no push up) 2 x 10     Low trap Y at wall: 2x10    Bent over row- EZ curl bar 2x10    Banded horizontal abduction: YTB 2x10    Standing open books at wall: x10ea Sci-fit Upper extremity bike x3 min forward and 3 min backward on L3     Face Pulls RTB 2 x 10     D2 flexion with RTB 2 x 10     Band pull aparts with GTB 2 x 10     Holding weighted ball (1 kg) on wall to trace alphabet x 1 each arm     Unilateral bent over row 2 x 10 with 6 pound weight (contralateral arm and leg on mat table for support)     Bent over reverse fly with 3 pound weights in each hand and tactile cues to perform appropriately 2 x 10     Unilateral shoulder extension with focus on scapular retraction 2 x 10     The patient's paper HEP was created and given to the patient this date UBE - level 3, 2 min fwd and 2 min bwd (4 min total)    Face pulls RTB 2 x 12     Shoulder ER at 90-90 RTB, B UE 2 x 10     R shoulder scaption in full range RTB, 2 x 10     B  shoulder ER neutral shoulder RTB 2 x 10     1/2 kneeling thoracic open book, B UE 2 x 10     Bent over Ts (bent elbows) B 3lb weight 2 x 10     Overhead shoulder press B 3 lb weight x 10    Prone W 2 x 10     Prone T (bent elbow) 2 x 10         Time (minutes) 8 20 43 29     Manual Therapy:  97140  Pt received manual therapy for the following: improve  restricted and/or painful movement of soft tissue, improve restricted and/or painful movement of joint and/or spinal segment, Improve swelling/edema         STM R UT, R periscapular area    Supine SOR    Minimal tenderness noted   XX                                               Time (minutes)   10       Neuromuscular Re-education: 97112  Pt received neuromuscular re-education for the following: restoring or maintaining balance, restoring or maintaining coordination,, restoring or maintaining kinesthetic sense,, restoring or maintaining posture, restoring or maintaining proprioception          Time (minutes)         Therapeutic Activities: 97530  Pt received therapeutic activities for the following: use of functional activities to restore or maintain functional performance          Time (minutes)       Modalities         Time (minutes)         PT Low Complexity Eval: 16109      Pt received Low complexity evaluation- refer to evaluation and POC             Total timed coded treatment minutes: 8 30 43 29   Total treatment minutes (timed + untimed) 41 30 43 29   Response to Treatment Pt presents with c/c of R sided back pain. Pain presents to be in R sided thoracic region and inferior scapula. Deficits include painful cervical ROM, painful R shoulder strength, poor scapular stabilizing strength noted, and hypomobility noted in upper thoracic spine. Functional reported difficulties include pain while playing instrument for [facility]. Pt is a good candidate for skilled OP PT to address deficits noted above.  Continued emphasis on scapular strengthening, thoracic mobility. Did not report any tenderness during manuals and was able to tolerate strengthening progressions without symptoms.  Patient with good tolerance to all exercises-he required intermittent cues (both tactile and verbal) to turn on his scapular muscles throughout his exercises. He reported feeling fatigued but had no pain following session.  Pt  reports fatigue with overhead shoulder strengthening (maintaining shoulder flexion during kneeling open books and shoulder presses). Pt demonstrates good scapular retraction throughout scapular strengthening performed.      Daily patient education  The patient received education on the purpose of therapy, participated in the development of the POC and verbalized understanding and agreement of POC, goals.    Patient was educated over the importance of scapular retraction during all exercises to perform with appropriate mechanics       Plan for Next Visit:  Pt will follow up with PT for 2x/week for 4 weeks. Pt  will benefit from skilled PT to address the impairments and functional limitations listed above in order to return to PLOF without pain or limitations. PT will plan to progress towards discharge to independent HEP as functional and objective goals are met.    Progress R scapular strength, generalized posterior chain strength     Progress pain free L cervical rotation ROM  Discontinue manuals NV if able.    Continue PRE scapular strengthening as tolerated. Progress scapular strengthening PRE  1 more return visit, then re-eval    Progress overhead scapular strength       This note serves as a Discharge Summary should the patient not return to physical therapy per the above plan of care.  __________________________________________________________________________________________________

## 2024-01-16 NOTE — Telephone Encounter (Signed)
 Refill request

## 2024-01-22 ENCOUNTER — Encounter: Admit: 2024-01-22 | Discharge: 2024-01-22 | Payer: Medicaid (Managed Care)

## 2024-01-22 DIAGNOSIS — G8929 Other chronic pain: Secondary | ICD-10-CM

## 2024-01-22 NOTE — Progress Notes (Signed)
 Insurance Precertification Questionnaire: n/a    Physical Therapy Discharge and Plan of Care  Name: Lyrick Lagrand     Date of Birth: April 06, 1996      MRN: [id number]         Date of Re Evaluation: 01/22/2024  Referring Physician: Frederik Jansky, MD  [phone]  [address], [address]rd floor  [place],  [place] [address] Phone: [phone] Fax: [phone]  Specific Order: Eval and Treat   Primary Medical Diagnosis:   1. Chronic right-sided thoracic back pain        2. Chronic midline thoracic back pain          Insurance: Payor: [employer]GED MEDICAID / Plan: [employer] [place]  / Product Type: Medicaid Mng[place] care /     Subjective/History:  Date of Onset:   History of Current Problem/Reason for Referral: Logon Tyheem Boughner is a 28 y.o. male who presents today with chief complaint of      Past Medical History:   Diagnosis Date    DVT (deep venous thrombosis) (CMS-HCC) DX 2018    Hypertension     Obesity    No past surgical history on file.  Medications: reviewed in EMR                       12/03/2023 01/22/2024   Pain Assessment   Area 1 R thoracic pain R thoracic pain    Now (0-10/10) 4 1   Worst in last 30 days 8 6   Best in last 30 days 2 0   Quality aching aching         10/17/2022 12/03/2023   Current/Prior Level of Function   Cooking Current: independent;modified independent   Cleaning Current: independent;modified independent   Grocery shopping Current: independent    Laundry Current: independent independent   Bathing Current: independent    Dressing Current: independent    Toileting Current: independent    Sleeping, in hours Current:  6-7   Other Current:  reports immediate thoracic pain when playing instrument       Objective:  Number of visits completed: 8  Number of visits missed/Not rescheduled:       10/17/2022 12/03/2023   Posture: within functional limits or tested as follows:   Head forward forward   Shoulder forward    Scapula protracted right;protracted left    Thoracic  kyphosis increased    Lumbar lordosis decreased          12/03/2023 01/22/2024   Functional Movement Screen: within functional limits or tested as follows   Cervical Rotation Functional Painful Functional Non-painful   Comment: L cervical rotation increases R thoracic pain    Cervical Flexion Functional Non-painful Functional Non-painful   Cervical Extension Functional Non-painful Functional Non-painful   Thoraco-Lumbar Flexion Functional Non-painful Functional Non-painful   Thoraco-Lumbar Extension Functional Non-painful Functional Non-painful   Thoraco-Lumbar Rotation Functional Painful Functional Non-painful         12/03/2023 01/22/2024   Range of Motion: within functional limits or tested as follows:   Cervical Flexion 30 50   Cervical Extension 51 50   Cervical Right Rotation 70 70   Comment: relieves R thoracic pain    Cervical Left Rotation 70 70   Comment: increases R thoracic pain    Cervical Right Side Bend 25 45   Cervical Left Side Bend 28 40   Lumbar Flexion 75% WNL   Lumbar Extension 100% WNL   Lumbar Right  Rotation 75% WNL   Comment: increases R thoracic pain    Lumbar Left Rotation 100% WNL   Lumbar Right Side Bend 75% WNL   Lumbar Left Side Bend 75% WNL   Shoulder Flexion ROM Left [facility]    Shoulder Flexion ROM Right [facility]    Shoulder Abduction ROM Left [facility]    Shoulder Abduction ROM Right [facility]          12/03/2023 01/22/2024   Strength Eval: within functional limits or tested as follows:   Shoulder Flexion Left 4- 5   Shoulder Flexion Right 4- 5   Shoulder Abduction Left 4- 5   Shoulder Abduction Right 4- 5   Shoulder Internal Rotation Left 4 5   Shoulder Internal Rotation Right 4 5   Shoulder External Rotation Left 4 4+   Shoulder External Rotation Right 4 4+   Middle Trapezius Left 4- 4   Middle Trapezius right 4- 4   Lower Trapezius Left 4- 4   Lower Trapezius Right 4- 4   Elbow Extension Left 4+ 4+   Elbow Extension Right 4+ 4+   Elbow Flexion Left 4+ 5   Elbow Flexion Right 4+ 5   Hip Flexion Left 4+    Hip  Flexion Right 4+    Knee Flexion Left 4+    Knee Flexion Right 4+    Knee Extension Left 4+    Knee Extension Right 4+    Ankle Dorsiflexion Left 4+    Ankle Dorsiflexion Right 4+          10/17/2022   Flexibility   Pec Minor: Right supine, measured AC to surface in cm 7cm   Pec Minor: Left supine, measured AC to surface in cm 7cm         12/03/2023 01/22/2024   Additional Testing Information: within functional limits or tested as follows:   Other Special Test Mod O: 20% Mod ODI: 2%   Sensation intact/normal    Palpation NTTP B cervical paraspinals, suboccipitals, UT, LS, R rhomboids/MT/LT    Upper Thoracic Hypomobile    Mid Thoracic Hypomobile    Lower Thoracic WNL    Upper Lumbar WNL    Lower Lumbar WNL                Standardized Tests:      01/22/2024   Standardized Tests   Oswestry Score: 2 %             Assessment:  Patient presents with   contributing to functional limitations  . Patient would benefit from skilled PT to address the aforementioned deficits. Patient/family received education on the purpose of therapy, participated in the development of the POC and verbalized understanding and agreement of POC, goals.  Rehabilitation Potential: Based on this therapist's assessment, Mordecai Yordin Rhoda has   rehabilitation potential for the PT goals stated below:           12/03/2023 01/22/2024   Therapy Goals   Goal:  Patient will report improvement in AVERAGE pain in past week to demonstrate improved pain management allowing for increased functional mobility/ tolerance. Not Met Met   Goal: Patient will report improvement  in WORST reported pain to demonstrate improved pain management allowing for increased functional mobility/ tolerance. Not met Met   Goal: Patient demonstrates independence with HEP through demonstration/ teach back review to maintain benefits gained in therapy Not met Met   Goal:  Not Met Met   Comment: Goal: improve score by 10% to  reflect decreased back pain during ADLs, work tasks, and functional  mobility    Short Term Goal - Patient will improve cervical ROM deficit by: 5 Degrees    To support comfort/performance with: Work Tasks    In: 4 weeks    Status: Not Met Met   Short Term Goal - Patient will improve lumbar ROM deficit by: 20% or better    To support comfort/performance with: Work Immunologist;Sitting    In: 4 weeks    Status: Not Met Met   Short Term Goal - Patient will improve UE strength deficit by: 0.5 Muscle Grade    To support comfort/performance with: Overhead Reach;Recreational Activities;Work Tasks    In: 4 weeks    Status: Not Met Met   Short Term Goal - Patient will improve deficient movement screen components including: Cervical Rotation    To support comfort/performance with: Functional-Painfree    In: 4 weeks to support return to functional mobility.    Status: Not Met Met       Plan:        Patient/Family participated in the development of POC and verbalized understanding and agreement of POC, goals and treatment:          Therapy Frequency/Duration: Patient to receive skilled PT services up to   times per week for up to     or   starting from the first scheduled follow up visit within this plan of care.    Certification Period: 01/22/2024 - 90 days  Therapist Signature: Samuel Crock, PT  Date: 01/22/2024    Physician Certification  I certify that the above patient is under my care and requires the above services. These professional services are to be provided from an established plan, related to the diagnosis and reviewed by me every 90 days.     Additional comments/revisions:       Physician Name: Frederik Jansky, MD    Signature_______________________________________ Date: _______  Physical Therapy Daily Treatment Note    Name: Rhet Rorke     Date of Birth: Feb 13, 1996      MRN: [id number]    Date of evaluation: 12/03/2023  Referring Physician: Frederik Jansky, MD  [phone]  [address], [address]rd floor  [place],  [place] [address] Phone: [phone] Fax:  [phone]  Specific Order: Evaluate and treat   Primary Medical Diagnosis:   1. Chronic right-sided thoracic back pain            Insurance: Payor: HU[employer] MANAGED MEDICAID / Plan: [employer] [place]  / Product Type: Medicaid Mngd care /     Subjective/History:  Date of Onset: ~chronic   History of Current Problem/Reason for Referral: Raffi Acxel Dingee is a 28 y.o. male who presents today with chief complaint of   chronic back pain. Pain is underneath R shoulder blade. 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 Rest is easing. Denies B UE/LE symptoms. He also reports neck pain when shoulder blade pain increases. 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 Pt is R handed.     # of visits per insurance authorization:PT OT ST GETS 30 V EACH PCY, USED 0  # of visits per POC: 8    Therapy Goals:           12/03/2023   Therapy Goals   Goal:  Patient will  report improvement in AVERAGE pain in past week to demonstrate improved pain management allowing for increased functional mobility/ tolerance. Not Met   Goal: Patient will report improvement  in WORST reported pain to demonstrate improved pain management allowing for increased functional mobility/ tolerance. Not met   Goal: Patient demonstrates independence with HEP through demonstration/ teach back review to maintain benefits gained in therapy Not met   Goal:  Not Met   Comment: Goal: improve score by 10% to reflect decreased back pain during ADLs, work tasks, and functional mobility   Short Term Goal - Patient will improve cervical ROM deficit by: 5 Degrees   To support comfort/performance with: Work Tasks   In: 4 weeks   Status: Not Met   Short Term Goal - Patient will improve lumbar ROM deficit by: 20% or better   To support comfort/performance with: Work Immunologist;Sitting   In: 4 weeks   Status: Not Met   Short  Term Goal - Patient will improve UE strength deficit by: 0.5 Muscle Grade   To support comfort/performance with: Overhead Reach;Recreational Activities;Work Tasks   In: 4 weeks   Status: Not Met   Short Term Goal - Patient will improve deficient movement screen components including: Cervical Rotation   To support comfort/performance with: Functional-Painfree   In: 4 weeks to support return to functional mobility.   Status: Not Met          Referring Provider:Holliday, Bambi Lever, MD     Verified Signed POC:    YES[x]       NO[]            Date:     Attestation signed by Frederik Jansky, MD at 12/06/2023  2:31 PM          If no, action taken: Rerouted via  EMR[]   Fax[]    Mail[]       Date:  Updated POC date:        Verified Signed POC:    YES[]       NO[]            Date:   If no, action taken: Rerouted via  EMR[]   Fax[]    Mail[]       Date:    Primary Therapist:  Johnnye Nancy, PT       Precautions: hx of DVT R LE (MD aware, pt wears compression stockings), HTN managed with medication per pt (please refer to PMH above)   Date: 01/09/2024  01/14/24 01/16/24 Date: 01/22/2024     Visit #    Recommended  # visits/ frequency: 2x per week for 4 weeks  5 of 8 6 of 8  7 of 8  8 of 8   Subjective or Additional Info: Pt states his back pain is much better lately, but continues to note pain when standing and walking for or longer.    Patient reports that he had a little bit of back pain yesterday following playing his instrument for about an hour. He reports that he is feeling a lot better today, but he has not played yet today.  Pt reports HEP is helping to manage symptoms while playing his instrument. Symptoms increase if he is fatigued, but overall he is feeling good.  Pt notes major improvements in symptoms over the last few weeks, feels ready for D/C at this time.;           Therapeutic Exercise: 97110  Pt received therapeutic exercise for the following:  restoring or maintaining strength, restoring or maintaining endurance,  restoring or maintaining range of motion, restoring or maintaining flexibility Sci-fit stepper with upper and lower extremities x14min    Prone scap retract + extension x10    Face Pulls YTB 3x10    Wall angels 2x10    Push up plus on wall (no push up) 2 x 10     Low trap Y at wall: 2x10    Bent over row- EZ curl bar 2x10    Banded horizontal abduction: YTB 2x10    Standing open books at wall: x10ea Sci-fit Upper extremity bike x3 min forward and 3 min backward on L3     Face Pulls RTB 2 x 10     D2 flexion with RTB 2 x 10     Band pull aparts with GTB 2 x 10     Holding weighted ball (1 kg) on wall to trace alphabet x 1 each arm     Unilateral bent over row 2 x 10 with 6 pound weight (contralateral arm and leg on mat table for support)     Bent over reverse fly with 3 pound weights in each hand and tactile cues to perform appropriately 2 x 10     Unilateral shoulder extension with focus on scapular retraction 2 x 10     The patient's paper HEP was created and given to the patient this date UBE - level 3, 2 min fwd and 2 min bwd (4 min total)    Face pulls RTB 2 x 12     Shoulder ER at 90-90 RTB, B UE 2 x 10     R shoulder scaption in full range RTB, 2 x 10     B shoulder ER neutral shoulder RTB 2 x 10     1/2 kneeling thoracic open book, B UE 2 x 10     Bent over Ts (bent elbows) B 3lb weight 2 x 10     Overhead shoulder press B 3 lb weight x 10    Prone W 2 x 10     Prone T (bent elbow) 2 x 10      UBE - level 3, 2 min fwd and 2 min bwd (4 min total)    Face pulls: RTB 2x10    Standing shoulder flexion ER iso: 2x10          Time (minutes) 20 43 29 10     Manual Therapy:  97140  Pt received manual therapy for the following: improve restricted and/or painful movement of soft tissue, improve restricted and/or painful movement of joint and/or spinal segment, Improve swelling/edema    STM R UT, R periscapular area    Supine SOR    Minimal tenderness noted   XX                                                Time  (minutes) 10        Neuromuscular Re-education: 97112  Pt received neuromuscular re-education for the following: restoring or maintaining balance, restoring or maintaining coordination,, restoring or maintaining kinesthetic sense,, restoring or maintaining posture, restoring or maintaining proprioception        Time (minutes)         Therapeutic Activities: 97530  Pt received therapeutic activities for the following: use of functional activities to restore or maintain functional performance  Er-Eval tests/Measures. Printed off HEP, issued GTB    Time (minutes)    15   Modalities         Time (minutes)         PT Low Complexity Eval: 47829                Total timed coded treatment minutes: 30 43 29 30   Total treatment minutes (timed + untimed) 30 43 29 30   Response to Treatment Continued emphasis on scapular strengthening, thoracic mobility. Did not report any tenderness during manuals and was able to tolerate strengthening progressions without symptoms.  Patient with good tolerance to all exercises-he required intermittent cues (both tactile and verbal) to turn on his scapular muscles throughout his exercises. He reported feeling fatigued but had no pain following session.  Pt reports fatigue with overhead shoulder strengthening (maintaining shoulder flexion during kneeling open books and shoulder presses). Pt demonstrates good scapular retraction throughout scapular strengthening performed.  Pt has completed 8 visits for R scapular/back pain. Pt states his pain has nearly subsided at rest, and only notices minor symptoms after 2 continuous hours of playing his instrument.    Objectively, patient's strength and ROM have improved to WNL, ODI has improved from 20% to 2%. Pt feels confident in his ability to complete his HEP and manage his symptoms IND at this time.       Daily patient education   Patient was educated over the importance of scapular retraction during all exercises to perform with appropriate  mechanics        Plan for Next Visit:  Discontinue manuals NV if able.    Continue PRE scapular strengthening as tolerated. Progress scapular strengthening PRE  1 more return visit, then re-eval    Progress overhead scapular strength   D/C to HEP     This note serves as a Discharge Summary should the patient not return to physical therapy per the above plan of care.  __________________________________________________________________________________________________

## 2024-01-27 ENCOUNTER — Ambulatory Visit: Admit: 2024-01-27 | Discharge: 2024-01-27 | Payer: Medicaid (Managed Care)

## 2024-01-27 DIAGNOSIS — I10 Essential (primary) hypertension: Secondary | ICD-10-CM

## 2024-01-27 NOTE — Progress Notes (Signed)
 Subjective   Devin Miller is a 28 y.o. male who presents for No chief complaint on file.  History of Present Illness  Devin Miller is a 28 year old male with hypertension who presents for blood pressure management.    He is currently on valsartan  80 mg and amlodipine  10 mg for hypertension. There is no worsening of leg swelling with the increased dose of amlodipine . No chest pain, headaches, unusual fatigue, shortness of breath, or sweats. His last home blood pressure reading was on March 8, and he notes that his readings tend to be higher in the office compared to at home.    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 He describes it as 'breathing a little bit heavily' or 'gasping for air' during performances. He sometimes notices this when walking around campus or climbing stairs, but it is not a consistent issue. He acknowledges that he might be holding his breath during difficult passages while playing.    He is not currently being treated for sleep apnea but has a referral for it. He is mindful of his dietary salt intake, usually not adding salt to his food, but acknowledges that eating out might increase his salt consumption.  Answers submitted by the patient for this visit:  High Blood Pressure Questionnaire (Submitted on 01/27/2024)  Chief Complaint: Hypertension  Chronicity: chronic  Onset: more than 1 year ago  Progression since onset: waxing and waning  Condition status: resistant  anxiety: No  blurred vision: No  chest pain: No  headaches: No  malaise/fatigue: No  neck pain: No  orthopnea: No  palpitations: No  peripheral edema: Yes  PND: No  shortness of breath: No  sweats: No  Agents associated with hypertension: no associated agents  CAD risks: obesity  Compliance problems: no compliance problems    Review of Systems   Respiratory:  Positive for shortness of breath.        Objective   There were no vitals taken for this  visit.  Physical Exam  NECK: No jugular venous distension.  CHEST: Lungs clear to auscultation bilaterally.  CARDIOVASCULAR: Regular rate and rhythm. No murmurs, gallops, or rubs.  EXTREMITIES: Chronic lymphedema, worse on the right than the left, unchanged.    Results         Assessment & Plan  Hypertension  Hypertension is managed with Valsartan  80 mg and Amlodipine  10 mg. No side effects from increased Amlodipine  dose. No chest pain, headaches, unusual fatigue, shortness of breath, or sweats. Home blood pressure readings from March 8 indicate higher readings in the office than at home. Target blood pressure is 130/80 mmHg. Discussed potential Valsartan  increase to 160 mg if home readings exceed target. Plan to combine medications into a single formulation once dosing is optimized. Advised to monitor dietary salt intake due to potential salt sensitivity.  - Continue Amlodipine  10 mg daily.  - Continue Valsartan  80 mg daily.  - Perform home blood pressure monitoring: two readings in the morning and two readings at night for one week.  - Increase Valsartan  to 160 mg if home readings exceed 130/80 mmHg.  - Plan to combine Amlodipine  and Valsartan  into a single formulation once dosing is optimized.  - Advise to check blood pressure before taking medication.  - Advise to monitor dietary salt intake.    Shortness of breath during playing double bass  Reports shortness of breath while playing double bass, likely  due to breath restriction during difficult passages. No shortness of breath during other physical activities such as walking or climbing stairs. Advised to focus on breathing techniques while playing.  - Monitor for any shortness of breath during non-playing activities.  - Consider consulting with a music instructor for breathing techniques while playing.    Chronic lymphedema  Chronic lymphedema is present, worse on the right than the left, and remains unchanged. No new symptoms or exacerbations  reported.    History of right upper back pain  Right upper back pain is resolved. He reports no pain and is able to play music without discomfort. Improvement attributed to adherence to physical therapy exercises.         Attestation

## 2024-01-27 NOTE — Patient Instructions (Addendum)
 VISIT SUMMARY:  Today, we discussed your blood pressure management and other health concerns. Your current medications for hypertension are working well, and we reviewed your home blood pressure readings. We also talked about your shortness of breath while playing the double bass and your chronic lymphedema. Your right upper back pain has resolved, and you are able to play music without discomfort.    YOUR PLAN:  -HYPERTENSION: Hypertension, or high blood pressure, is being managed with your current medications, Valsartan  80 mg and Amlodipine  10 mg. You should continue taking these medications as prescribed. If your home blood pressure readings exceed 130/80 mmHg, we will increase Valsartan  to 160 mg and check your kidney function with a blood test one week after the change. Monitor your blood pressure at home by taking two readings in the morning and two at night for one week. We will consider combining your medications into a single pill once the dosing is optimized. Also, be mindful of your dietary salt intake as it can affect your blood pressure.    -SHORTNESS OF BREATH DURING PLAYING DOUBLE BASS: You experience shortness of breath while playing the double bass, likely due to holding your breath during difficult passages. Focus on breathing techniques while playing and monitor for any shortness of breath during other activities. Consider consulting with a music instructor for help with breathing techniques.    -CHRONIC LYMPHEDEMA: Chronic lymphedema, which is swelling due to fluid build-up, is present but remains unchanged. There are no new symptoms or worsening of the condition.    -HISTORY OF RIGHT UPPER BACK PAIN: Your right upper back pain has resolved, and you are able to play music without discomfort. This improvement is likely due to your adherence to physical therapy exercises.    INSTRUCTIONS:  Please continue to monitor your blood pressure at home and follow the instructions provided. If your readings  exceed 130/80 mmHg, increase your Valsartan  dose to 160 mg. Focus on breathing techniques while playing the double bass and consider consulting with a music instructor. If you notice any new symptoms or worsening of your lymphedema, please contact us . Follow up with your referral for sleep apnea as planned.

## 2024-02-12 MED ORDER — propranoloL (INDERAL) 20 MG tablet
20 | ORAL_TABLET | ORAL | 3 refills | 30.00000 days | Status: AC
Start: 2024-02-12 — End: ?

## 2024-02-12 NOTE — Telephone Encounter (Signed)
 Medication refill request for Propanolol. Last appointment with [facility] 01/27/24. Upcoming appointment 02/13/24.

## 2024-02-13 ENCOUNTER — Ambulatory Visit: Admit: 2024-02-13 | Discharge: 2024-02-13 | Payer: Medicaid (Managed Care)

## 2024-02-13 DIAGNOSIS — R7303 Prediabetes: Secondary | ICD-10-CM

## 2024-02-13 MED ORDER — valsartan (DIOVAN) 80 MG tablet
80 | ORAL_TABLET | Freq: Every day | ORAL | 1 refills | 30.00000 days | Status: AC
Start: 2024-02-13 — End: 2024-03-11

## 2024-02-13 MED ORDER — amLODIPine (NORVASC) 10 MG tablet
10 | ORAL_TABLET | Freq: Every day | ORAL | 1 refills | 30.00000 days | Status: AC
Start: 2024-02-13 — End: ?

## 2024-02-13 NOTE — Progress Notes (Signed)
 Subjective   Devin Miller is a 28 y.o. male who presents for Follow-up (Allergies due to weather change. Coughing phlegm/mucus. Would like some sort of nasal spray. )  History of Present Illness  Devin Miller is a 28 year old male who presents with symptoms of an upper respiratory infection.    He has been experiencing a persistent cough and nasal congestion for the past week. The cough is productive of greenish phlegm, which began around Saturday. There is some throat irritation but no significant sore throat. Occasional nasal congestion and discharge, as well as eye discharge resembling 'eye boogers', are present. He notes these symptoms occur annually around this time and typically improve over time. No blood in the sputum, shortness of breath, or fever. Symptoms are gradually improving, though occasional coughing fits occur.    He has a history of hypertension, managed with amlodipine  10 mg and valsartan  80 mg. He monitors his blood pressure at home, with recent readings mostly in the 120s to 130s systolic range, though an outlier reading of 151/90 was noted. He acknowledges lifestyle factors affecting his blood pressure, including diet and physical activity changes since returning to school.    His weight has increased from 263 lbs in October to 280 lbs currently, attributed to decreased physical activity and increased consumption of fast food since returning to school. Previously, during the summer, he was more active and had healthier eating habits due to limited access to fast food and increased physical activity from walking.    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 He plans to return to [place], [place] , for the summer, where he anticipates increased physical activity and healthier eating habits.    Review of Systems    Objective   Blood pressure 141/79, pulse 88, temperature 97.7 F (36.5 C), temperature source Temporal, resp. rate 18, height 5' 11  (1.803 m), weight (!) 280 lb (127 kg), SpO2 97%.   MyChart Blood Pressure Flowsheet     Systolic Diastolic   01/28/2024 129 (PR) 83 (PR)    138 (PR) 85 (PR)    136 (PR) 84 (PR)    145 (PR) 86 (PR)   01/29/2024 129 (PR) 88 (PR)    137 (PR) 87 (PR)   01/30/2024 130 (PR) 79 (PR)    151 (PR) 75 (PR)    136 (PR) 89 (PR)    149 (PR) 98 (PR)   01/31/2024 133 (PR) 83 (PR)    138 (PR) 90 (PR)   02/01/2024 127 (PR) 73 (PR)    124 (PR) 77 (PR)    138 (PR) 87 (PR)    139 (PR) 84 (PR)   02/02/2024 136 (PR) 88 (PR)    135 (PR) 86 (PR)      Legend:  (PR) Patient-reported    Wt Readings from Last 10 Encounters:   02/13/24 (!) 280 lb (127 kg)   01/27/24 (!) 282 lb 6.4 oz (128.1 kg)   12/25/23 (!) 283 lb (128.4 kg)   12/02/23 (!) 283 lb (128.4 kg)   07/10/23 (!) 263 lb (119.3 kg)   08/27/22 (!) 280 lb (127 kg)   08/15/22 (!) 282 lb (127.9 kg)   06/06/18 (!) 239 lb (108.4 kg)   01/13/18 (!) 222 lb 10.6 oz (101 kg)   12/23/17 (!) 224 lb (101.6 kg)       Physical Exam  VITALS: BP- 141/79  MEASUREMENTS: Weight- 280.  HEENT: Minimal congestion on right  side, slight rhinorrhea. Throat normal, no exudate or redness.  NECK: No cervical lymphadenopathy. No sinus tenderness.    Results         Assessment & Plan  Upper respiratory infection  Symptoms consistent with a likely viral upper respiratory infection, presenting with cough and greenish phlegm. Symptoms began approximately one week ago and are improving. No fever or shortness of breath reported. Not currently contagious as per CDC guidelines.  - Advise wearing a mask if still coughing, especially around immunocompromised individuals.  - Report any fever or worsening symptoms.    Primary hypertension  Blood pressure readings at home mostly within target range (120s-130s/70s-80s) with occasional outliers. Current office reading was 141/79. Focus on primary prevention as he is young and has no history of cardiovascular events. Emphasis on lifestyle modifications over medication adjustments at this  time.  - Continue Amlodipine  10 mg daily and Valsartan  80 mg daily.  - Focus on lifestyle changes, including dietary modifications and physical activity.  - Monitor for symptoms of hypotension if significant lifestyle changes occur, and consider reducing Amlodipine  if necessary.    Obesity  Weight has increased from 263 lbs in October to 280 lbs currently. Contributing factors include decreased physical activity and increased fast food consumption due to lifestyle changes. Plans to return to a more active lifestyle in the summer. Discussed strategies to reduce eating window and avoid high-calorie meals late in the day. Encouraged to incorporate more physical activity and consider dietary guidance from a dietitian.  - Encourage lifestyle modifications, including reducing eating window to no more than 12 hours and avoiding high-calorie meals late in the day.  - Recommend incorporating more physical activity, such as air squats every 30 minutes during sedentary activities.  - Refer to online dietitian service '[employer]' for dietary guidance.  - Discuss potential benefits of meal preparation and reducing fast food consumption.    General Health Maintenance  Discussion of HPV vaccination status. He is unsure if vaccinated.  - Check vaccination records for HPV vaccine status.  - Administer HPV vaccine series if not previously completed.    Follow-up  Plans for follow-up after summer activities.  - Schedule follow-up appointment after returning in mid-August to reassess blood pressure and weight management progress.  - Ensure early refill of medications before traveling out of town.         Attestation

## 2024-02-13 NOTE — Patient Instructions (Signed)
 VISIT SUMMARY:  Today, you were seen for symptoms of an upper respiratory infection, which include a persistent cough and nasal congestion. We also reviewed your blood pressure management and discussed your recent weight gain. Additionally, we talked about general health maintenance, including your vaccination status.    YOUR PLAN:  -UPPER RESPIRATORY INFECTION: You have a likely viral upper respiratory infection, which is causing your cough and nasal congestion. These symptoms are improving. Please wear a mask if you are still coughing, especially around people with weakened immune systems. Report any fever or worsening symptoms.    -PRIMARY HYPERTENSION: Your blood pressure is mostly within the target range, but there have been occasional higher readings. Continue taking your current medications, Amlodipine  10 mg daily and Valsartan  80 mg daily. Focus on lifestyle changes, such as improving your diet and increasing physical activity. Monitor for symptoms of low blood pressure if you make significant lifestyle changes.    -OBESITY: Your weight has increased recently due to decreased physical activity and increased fast food consumption. We discussed strategies to help you manage your weight, including reducing your eating window to no more than 12 hours, avoiding high-calorie meals late in the day, and incorporating more physical activity. Consider using the online dietitian service '[employer]ourish' for dietary guidance.    -GENERAL HEALTH MAINTENANCE: We discussed your HPV vaccination status. Please check your vaccination records. If you have not completed the HPV vaccine series, we recommend getting vaccinated.    INSTRUCTIONS:  Please schedule a follow-up appointment after you return in mid-August to reassess your blood pressure and weight management progress. Ensure you get an early refill of your medications before traveling out of town.              Contains text generated by [employer].

## 2024-02-17 NOTE — Telephone Encounter (Signed)
 Called patient in regards to MyChart message from 02/14/2024 to offer appt at [facility]. States he is in [place] now. Advised to go to an urgent care if he is not feeling better. Patient verbalized understanding,  did not sound distressed.

## 2024-02-27 NOTE — Progress Notes (Signed)
 Compression stocking ordered per specifications given by [facility] for Lymphedema.

## 2024-03-11 MED ORDER — valsartan (DIOVAN) 80 MG tablet
80 | ORAL_TABLET | Freq: Every day | ORAL | 1 refills | 30.00000 days | Status: AC
Start: 2024-03-11 — End: ?

## 2024-03-12 NOTE — Telephone Encounter (Signed)
 Returned patient call in regards to an issue with getting compression socks from [facility]. States that they need recent chart notes to be sent through [facility] system. Explained that we will look into this and get back to him by [employer]. Is agreeable to the plan.

## 2024-03-20 MED FILL — CHOLECALCIFEROL (VITAMIN D3) 50 MCG (2,000 UNIT) TABLET: 50 50 mcg (2,000 unit) | ORAL | 100 days supply | Qty: 100 | Fill #1

## 2024-05-20 NOTE — Telephone Encounter (Signed)
 Medication refill request for propanolol. Last appointment with [facility] 02/13/24. Upcoming appointment none.

## 2024-05-21 MED ORDER — propranoloL (INDERAL) 20 MG tablet
20 | ORAL_TABLET | ORAL | 3 refills | 30.00000 days | Status: AC
Start: 2024-05-21 — End: ?
